# Patient Record
Sex: Female | Born: 1940 | Race: White | Hispanic: No | Smoking: Former smoker
Health system: Southern US, Community
[De-identification: ages and names within clinical notes are randomized; demographics above are authoritative.]

## PROBLEM LIST (undated history)

## (undated) DIAGNOSIS — R51 Headache: Secondary | ICD-10-CM

## (undated) DIAGNOSIS — N644 Mastodynia: Secondary | ICD-10-CM

## (undated) DIAGNOSIS — K219 Gastro-esophageal reflux disease without esophagitis: Secondary | ICD-10-CM

## (undated) DIAGNOSIS — F419 Anxiety disorder, unspecified: Secondary | ICD-10-CM

## (undated) DIAGNOSIS — R319 Hematuria, unspecified: Secondary | ICD-10-CM

## (undated) DIAGNOSIS — E119 Type 2 diabetes mellitus without complications: Secondary | ICD-10-CM

## (undated) DIAGNOSIS — N63 Unspecified lump in unspecified breast: Principal | ICD-10-CM

## (undated) DIAGNOSIS — I10 Essential (primary) hypertension: Secondary | ICD-10-CM

## (undated) DIAGNOSIS — R350 Frequency of micturition: Secondary | ICD-10-CM

## (undated) HISTORY — DX: Type 2 diabetes mellitus without complications: E11.9

## (undated) HISTORY — DX: Hematuria, unspecified: R31.9

## (undated) HISTORY — PX: ABDOMINAL HYSTERECTOMY: SHX81

## (undated) HISTORY — DX: Mastodynia: N64.4

## (undated) HISTORY — PX: OTHER SURGICAL HISTORY: SHX169

## (undated) HISTORY — DX: Unspecified lump in unspecified breast: N63.0

## (undated) HISTORY — DX: Frequency of micturition: R35.0

## (undated) HISTORY — PX: CHOLECYSTECTOMY: SHX55

## (undated) HISTORY — PX: APPENDECTOMY: SHX54

---

## 1998-05-10 ENCOUNTER — Other Ambulatory Visit: Admission: RE | Admit: 1998-05-10 | Discharge: 1998-05-10 | Payer: Self-pay | Admitting: Obstetrics and Gynecology

## 1999-12-26 ENCOUNTER — Encounter: Payer: Self-pay | Admitting: Obstetrics and Gynecology

## 1999-12-26 ENCOUNTER — Encounter: Admission: RE | Admit: 1999-12-26 | Discharge: 1999-12-26 | Payer: Self-pay | Admitting: Obstetrics and Gynecology

## 2000-07-17 ENCOUNTER — Other Ambulatory Visit: Admission: RE | Admit: 2000-07-17 | Discharge: 2000-07-17 | Payer: Self-pay | Admitting: Obstetrics and Gynecology

## 2001-01-09 ENCOUNTER — Encounter: Admission: RE | Admit: 2001-01-09 | Discharge: 2001-01-09 | Payer: Self-pay | Admitting: Obstetrics and Gynecology

## 2001-01-09 ENCOUNTER — Encounter: Payer: Self-pay | Admitting: Obstetrics and Gynecology

## 2001-07-10 ENCOUNTER — Other Ambulatory Visit: Admission: RE | Admit: 2001-07-10 | Discharge: 2001-07-10 | Payer: Self-pay | Admitting: Obstetrics and Gynecology

## 2001-07-16 ENCOUNTER — Encounter: Payer: Self-pay | Admitting: Obstetrics and Gynecology

## 2001-07-16 ENCOUNTER — Encounter: Admission: RE | Admit: 2001-07-16 | Discharge: 2001-07-16 | Payer: Self-pay | Admitting: Obstetrics and Gynecology

## 2001-10-21 ENCOUNTER — Ambulatory Visit (HOSPITAL_COMMUNITY): Admission: RE | Admit: 2001-10-21 | Discharge: 2001-10-21 | Payer: Self-pay | Admitting: General Surgery

## 2002-01-21 ENCOUNTER — Encounter: Admission: RE | Admit: 2002-01-21 | Discharge: 2002-01-21 | Payer: Self-pay | Admitting: Obstetrics and Gynecology

## 2002-01-21 ENCOUNTER — Encounter: Payer: Self-pay | Admitting: Obstetrics and Gynecology

## 2002-01-28 ENCOUNTER — Encounter: Payer: Self-pay | Admitting: Internal Medicine

## 2002-01-28 ENCOUNTER — Encounter: Admission: RE | Admit: 2002-01-28 | Discharge: 2002-01-28 | Payer: Self-pay | Admitting: Internal Medicine

## 2002-04-02 ENCOUNTER — Ambulatory Visit (HOSPITAL_COMMUNITY): Admission: RE | Admit: 2002-04-02 | Discharge: 2002-04-02 | Payer: Self-pay | Admitting: *Deleted

## 2003-01-20 ENCOUNTER — Encounter: Payer: Self-pay | Admitting: Internal Medicine

## 2003-01-20 ENCOUNTER — Encounter: Admission: RE | Admit: 2003-01-20 | Discharge: 2003-01-20 | Payer: Self-pay | Admitting: Internal Medicine

## 2003-05-10 ENCOUNTER — Encounter: Payer: Self-pay | Admitting: Emergency Medicine

## 2003-05-10 ENCOUNTER — Emergency Department (HOSPITAL_COMMUNITY): Admission: EM | Admit: 2003-05-10 | Discharge: 2003-05-10 | Payer: Self-pay | Admitting: Emergency Medicine

## 2004-01-26 ENCOUNTER — Encounter (HOSPITAL_COMMUNITY): Admission: RE | Admit: 2004-01-26 | Discharge: 2004-02-25 | Payer: Self-pay | Admitting: Internal Medicine

## 2004-02-09 ENCOUNTER — Encounter: Admission: RE | Admit: 2004-02-09 | Discharge: 2004-02-09 | Payer: Self-pay | Admitting: Internal Medicine

## 2004-07-01 ENCOUNTER — Emergency Department (HOSPITAL_COMMUNITY): Admission: EM | Admit: 2004-07-01 | Discharge: 2004-07-01 | Payer: Self-pay | Admitting: Emergency Medicine

## 2004-07-07 ENCOUNTER — Ambulatory Visit: Admission: RE | Admit: 2004-07-07 | Discharge: 2004-07-07 | Payer: Self-pay | Admitting: Emergency Medicine

## 2005-04-11 ENCOUNTER — Encounter: Admission: RE | Admit: 2005-04-11 | Discharge: 2005-04-11 | Payer: Self-pay | Admitting: Internal Medicine

## 2006-04-17 ENCOUNTER — Encounter: Admission: RE | Admit: 2006-04-17 | Discharge: 2006-04-17 | Payer: Self-pay | Admitting: Internal Medicine

## 2006-11-28 ENCOUNTER — Emergency Department (HOSPITAL_COMMUNITY): Admission: EM | Admit: 2006-11-28 | Discharge: 2006-11-28 | Payer: Self-pay | Admitting: Emergency Medicine

## 2007-05-11 ENCOUNTER — Encounter: Admission: RE | Admit: 2007-05-11 | Discharge: 2007-05-11 | Payer: Self-pay | Admitting: Obstetrics and Gynecology

## 2007-05-14 ENCOUNTER — Encounter: Admission: RE | Admit: 2007-05-14 | Discharge: 2007-05-14 | Payer: Self-pay | Admitting: Obstetrics and Gynecology

## 2007-09-03 ENCOUNTER — Encounter: Admission: RE | Admit: 2007-09-03 | Discharge: 2007-09-03 | Payer: Self-pay | Admitting: Obstetrics and Gynecology

## 2008-05-31 ENCOUNTER — Encounter: Admission: RE | Admit: 2008-05-31 | Discharge: 2008-05-31 | Payer: Self-pay | Admitting: Obstetrics and Gynecology

## 2009-06-13 ENCOUNTER — Encounter: Admission: RE | Admit: 2009-06-13 | Discharge: 2009-06-13 | Payer: Self-pay | Admitting: Obstetrics and Gynecology

## 2010-07-04 ENCOUNTER — Encounter: Admission: RE | Admit: 2010-07-04 | Discharge: 2010-07-04 | Payer: Self-pay | Admitting: Internal Medicine

## 2010-07-10 ENCOUNTER — Encounter: Admission: RE | Admit: 2010-07-10 | Discharge: 2010-07-10 | Payer: Self-pay | Admitting: Internal Medicine

## 2011-03-12 ENCOUNTER — Emergency Department (HOSPITAL_COMMUNITY): Payer: Medicare Other

## 2011-03-12 ENCOUNTER — Emergency Department (HOSPITAL_COMMUNITY)
Admission: EM | Admit: 2011-03-12 | Discharge: 2011-03-12 | Disposition: A | Discharge status: Home / Self Care | Payer: Medicare Other | Attending: Emergency Medicine | Admitting: Emergency Medicine

## 2011-03-12 DIAGNOSIS — Z7982 Long term (current) use of aspirin: Secondary | ICD-10-CM | POA: Insufficient documentation

## 2011-03-12 DIAGNOSIS — Z79899 Other long term (current) drug therapy: Secondary | ICD-10-CM | POA: Insufficient documentation

## 2011-03-12 DIAGNOSIS — R072 Precordial pain: Secondary | ICD-10-CM | POA: Insufficient documentation

## 2011-03-12 DIAGNOSIS — I10 Essential (primary) hypertension: Secondary | ICD-10-CM | POA: Insufficient documentation

## 2011-03-12 DIAGNOSIS — K219 Gastro-esophageal reflux disease without esophagitis: Secondary | ICD-10-CM | POA: Insufficient documentation

## 2011-03-12 LAB — DIFFERENTIAL
Basophils Relative: 0 % (ref 0–1)
Monocytes Absolute: 0.5 10*3/uL (ref 0.1–1.0)
Monocytes Relative: 6 % (ref 3–12)
Neutro Abs: 4.5 10*3/uL (ref 1.7–7.7)

## 2011-03-12 LAB — CBC
Hemoglobin: 13.2 g/dL (ref 12.0–15.0)
MCH: 27.1 pg (ref 26.0–34.0)
MCHC: 32 g/dL (ref 30.0–36.0)

## 2011-03-12 LAB — POCT CARDIAC MARKERS: Myoglobin, poc: 122 ng/mL (ref 12–200)

## 2011-03-12 LAB — BASIC METABOLIC PANEL
CO2: 28 mEq/L (ref 19–32)
Calcium: 9.7 mg/dL (ref 8.4–10.5)
Creatinine, Ser: 0.83 mg/dL (ref 0.4–1.2)
GFR calc Af Amer: >60 mL/min (ref >60)
Glucose, Bld: 126 mg/dL — ABNORMAL HIGH (ref 70–99)

## 2011-03-25 ENCOUNTER — Other Ambulatory Visit (HOSPITAL_COMMUNITY): Payer: Self-pay | Admitting: Internal Medicine

## 2011-03-26 ENCOUNTER — Encounter (HOSPITAL_COMMUNITY): Payer: Medicare Other | Attending: Internal Medicine

## 2011-03-26 ENCOUNTER — Encounter (HOSPITAL_COMMUNITY)
Admission: RE | Admit: 2011-03-26 | Discharge: 2011-03-26 | Disposition: A | Discharge status: Home / Self Care | Payer: Medicare Other | Source: Ambulatory Visit | Attending: Internal Medicine | Admitting: Internal Medicine

## 2011-03-26 DIAGNOSIS — R079 Chest pain, unspecified: Secondary | ICD-10-CM | POA: Insufficient documentation

## 2011-03-26 MED ORDER — TECHNETIUM TC 99M TETROFOSMIN IV KIT
30.0000 | PACK | Freq: Once | INTRAVENOUS | Status: AC | PRN
  Administered 2011-03-26: 29 via INTRAVENOUS

## 2011-03-27 ENCOUNTER — Encounter (HOSPITAL_COMMUNITY): Payer: Medicare Other

## 2011-03-27 ENCOUNTER — Encounter (HOSPITAL_COMMUNITY)
Admission: RE | Admit: 2011-03-27 | Discharge: 2011-03-27 | Disposition: A | Discharge status: Home / Self Care | Payer: Medicare Other | Source: Ambulatory Visit | Attending: Internal Medicine | Admitting: Internal Medicine

## 2011-03-27 MED ORDER — TECHNETIUM TC 99M TETROFOSMIN IV KIT
20.0000 | PACK | Freq: Once | INTRAVENOUS | Status: AC | PRN
  Administered 2011-03-27: 21.7 via INTRAVENOUS

## 2011-03-31 NOTE — Progress Notes (Signed)
  [name], Robyn Pena                ACCOUNT NO.:  [id number]  MEDICAL RECORD NO.:  [id number]          PATIENT TYPE:  LOCATION:                                 FACILITY:  PHYSICIAN:  Kingsley Callander. Ouida Sills, MD            DATE OF BIRTH:  DATE OF PROCEDURE:  03/26/2011 DATE OF DISCHARGE:                                PROGRESS NOTE   [name] underwent a Myoview stress test for evaluation of recent symptoms of chest pain.  She exercised 5 minutes 41 seconds (2 minutes 41 seconds of stage II of the Bruce protocol) attaining a maximal heart rate of 144 (95% for the age predicted maximal heart rate) and a workload of 7 minutes and discontinued exercise due to shortness of breath.  She did not experience chest pain.  There were PACs noted. There were no ST-segment changes diagnostic of ischemia.  Baseline electrocardiogram revealed normal sinus rhythm at 62 beats per minute. She was hypertensive initially at 172/90.  There was a hypertensive response to exercise reaching a peak of 208/88 in early recovery.  She has history of white coat hypertension.  IMPRESSION: 1. No evidence of exercise-induced ischemia. 2. White coat hypertension with resting hypertension and a     hypertensive response to exercise. 3. Myoview images pending.     Kingsley Callander. Ouida Sills, MD     ROF/MEDQ  D:  03/26/2011  T:  03/27/2011  Job:  [id number]  Electronically Signed by Carylon Perches MD on 03/31/2011 09:35:29 AM

## 2011-05-10 NOTE — Group Therapy Note (Signed)
NAMEJASEMINE, NAWAZ                            ACCOUNT NO.:  [id number]   MEDICAL RECORD NO.:  [id number]                  PATIENT TYPE:  PREC   LOCATION:                                       FACILITY:   PHYSICIAN:  Kingsley Callander. Ouida Sills, M.D.                  DATE OF BIRTH:   DATE OF PROCEDURE:  01/26/2004  DATE OF DISCHARGE:                                   PROGRESS NOTE   PROCEDURE:  Cardiolite stress test.   DESCRIPTION OF PROCEDURE:  The patient exercised 7 minutes (one minute into  stage 3 of the Bruce protocol), attaining a maximal heart rate of 161 (101%  of the age-predicted maximal heart rate), at a workload of 10.1 mets and  discontinued exercise after surpassing her target heart rate.  There were  infrequent atrial premature complexes.  There were no symptoms of chest  pain.  There were no ST-segment changes diagnostic of ischemia.  There was a  hypertensive blood pressure response to exercise, reaching a peak of 224/92  in recovery.  The baseline electrocardiogram revealed normal sinus rhythm at  83 beats per minute.   IMPRESSION:  1. No evidence of exercise-induced ischemia.  2. Cardiolite images are pending.      ___________________________________________                                            Kingsley Callander. Ouida Sills, M.D.   ROF/MEDQ  D:  01/26/2004  T:  01/26/2004  Job:  [id number]

## 2011-05-10 NOTE — Op Note (Signed)
[facility]  Patient:    Robyn Pena, Robyn Pena Visit Number: [id number] MRN: [id number]          Service Type: DSU Location: DAY Attending Physician:  Dalia Heading Dictated by:   Franky Macho, M.D. Proc. Date: 10/21/01 Admit Date:  10/21/2001   CC:         Carylon Perches, M.D.   Operative Report  PATIENT AGE:  70 years.  PREOPERATIVE DIAGNOSIS:  Cholecystitis, cholelithiasis.  POSTOPERATIVE DIAGNOSIS:  Cholecystitis, cholelithiasis.  OPERATION:  Laparoscopic cholecystectomy.  SURGEON:  Franky Macho, M.D.  ASSISTANT:  Arna Snipe, M.D.  ANESTHESIA:  General endotracheal anesthesia.  INDICATIONS:  The patient is a 70 year old white female who presents with biliary colic secondary to cholelithiasis.  The risks and benefits of the procedure including bleeding, infection, hepatobiliary injury, and the possibility of an open procedure were fully explained to the patient who gave informed consent.  DESCRIPTION OF PROCEDURE:  The patient was placed in the supine position. After induction of general endotracheal anesthesia, the abdomen was prepped and draped using the usual sterile technique with Betadine.  A supraumbilical incision was made down to the fascia.  A Veress needle was introduced into the abdominal cavity, and confirmation of placement was done using the saline drop test.  The abdomen was then insufflated with 16 mmHg pressure.  An 11 mm trocar was introduced into the abdominal cavity under direct visualization without difficulty.  The patient was placed in reverse Trendelenburg position, and an additional 11 mm trocar was placed in the epigastric region under direct visualization.  An additional 5 mm trocar was then placed in the right upper quadrant, right flank regions.  The liver was inspected and noted to be within normal limits.  The gallbladder was retracted superiorly and laterally.  The dissection was begun around the  infundibulum of the gallbladder.  The cystic duct was first identified, the juncture to the infundibulum fully identified.  Endoclips were placed proximally and distally on the cystic duct, and the cystic duct was divided.  This was likewise done on the cystic artery.   The gallbladder was then freed away from the gallbladder fossa using Bovie electrocautery.  The gallbladder was delivered through the epigastric trocar site using an Endocatch bag.  The gallbladder fossa was inspected, and no abnormal bleeding or bile leakage was noted. Surgicel was placed in the gallbladder fossa.  Subhepatic space as well as right hepatic gutter were irrigated with normal saline.  All fluid and air were then evacuated from the abdominal cavity prior to removing the trocars.  All wounds were irrigated with normal saline.  All wounds were injected with 0.5% Marcaine.  The supraumbilical fascia as well as epigastric fascia were reapproximated using an 0 Vicryl interrupted suture.  All skin incisions were closed using staples.  Betadine ointment and dry sterile dressings were applied.  All tape and needle counts were correct at the end of the procedure.  The patient was extubated in the operating room and went back to the recovery room awake and in stable condition.  COMPLICATIONS:  None.  SPECIMEN:  Gallbladder with stones.  ESTIMATED BLOOD LOSS:  Minimal. Dictated by:   Franky Macho, M.D. Attending Physician:  Dalia Heading DD:  10/21/01 TD:  10/21/01 Job: [id number]

## 2011-05-30 ENCOUNTER — Other Ambulatory Visit: Payer: Self-pay | Admitting: Internal Medicine

## 2011-05-30 DIAGNOSIS — Z1231 Encounter for screening mammogram for malignant neoplasm of breast: Secondary | ICD-10-CM

## 2011-07-05 ENCOUNTER — Ambulatory Visit (INDEPENDENT_AMBULATORY_CARE_PROVIDER_SITE_OTHER): Payer: Medicare Other | Admitting: Urology

## 2011-07-05 DIAGNOSIS — R351 Nocturia: Secondary | ICD-10-CM

## 2011-07-05 DIAGNOSIS — R350 Frequency of micturition: Secondary | ICD-10-CM

## 2011-07-10 ENCOUNTER — Ambulatory Visit
Admission: RE | Admit: 2011-07-10 | Discharge: 2011-07-10 | Disposition: A | Discharge status: Home / Self Care | Payer: Medicare Other | Source: Ambulatory Visit | Attending: Internal Medicine | Admitting: Internal Medicine

## 2011-07-10 DIAGNOSIS — Z1231 Encounter for screening mammogram for malignant neoplasm of breast: Secondary | ICD-10-CM

## 2011-07-11 ENCOUNTER — Other Ambulatory Visit: Payer: Self-pay | Admitting: Internal Medicine

## 2011-07-11 DIAGNOSIS — R928 Other abnormal and inconclusive findings on diagnostic imaging of breast: Secondary | ICD-10-CM

## 2011-07-23 ENCOUNTER — Other Ambulatory Visit: Payer: Medicare Other

## 2011-07-24 ENCOUNTER — Ambulatory Visit
Admission: RE | Admit: 2011-07-24 | Discharge: 2011-07-24 | Disposition: A | Discharge status: Home / Self Care | Payer: Medicare Other | Source: Ambulatory Visit | Attending: Internal Medicine | Admitting: Internal Medicine

## 2011-07-24 DIAGNOSIS — R928 Other abnormal and inconclusive findings on diagnostic imaging of breast: Secondary | ICD-10-CM

## 2012-01-07 ENCOUNTER — Encounter: Payer: Self-pay | Admitting: Internal Medicine

## 2012-04-01 ENCOUNTER — Other Ambulatory Visit (INDEPENDENT_AMBULATORY_CARE_PROVIDER_SITE_OTHER): Payer: Self-pay | Admitting: *Deleted

## 2012-04-01 ENCOUNTER — Telehealth (INDEPENDENT_AMBULATORY_CARE_PROVIDER_SITE_OTHER): Payer: Self-pay | Admitting: *Deleted

## 2012-04-01 DIAGNOSIS — Z1211 Encounter for screening for malignant neoplasm of colon: Secondary | ICD-10-CM

## 2012-04-01 MED ORDER — PEG-KCL-NACL-NASULF-NA ASC-C 100 G PO SOLR: 1.0000 | Freq: Once | ORAL | Status: DC

## 2012-04-01 NOTE — Telephone Encounter (Signed)
Patient needs movi prep 

## 2012-05-05 ENCOUNTER — Encounter (INDEPENDENT_AMBULATORY_CARE_PROVIDER_SITE_OTHER): Payer: Self-pay | Admitting: *Deleted

## 2012-05-06 ENCOUNTER — Telehealth (INDEPENDENT_AMBULATORY_CARE_PROVIDER_SITE_OTHER): Payer: Self-pay | Admitting: *Deleted

## 2012-05-06 NOTE — Telephone Encounter (Signed)
PCP/Requesting MD: fagan  Name & DOB: Robyn Pena August 26, 2041     Procedure: tcs  Reason/Indication:  screening  Has patient had this procedure before?  yes  If so, when, by whom and where?  4/03  Is there a family history of colon cancer?  no  Who?  What age when diagnosed?    Is patient diabetic?   no      Does patient have prosthetic heart valve?  no  Do you have a pacemaker?  no  Has patient had joint replacement within last 12 months?  no  Is patient on Coumadin, Plavix and/or Aspirin? yes  Medications: asa 81 mg daily, omeprazole 20 mg bid, klor-con 20 mg daily, verapamil 240 mg in am & 120 mg in pm, losaratin/hcyz 100/25 mg daily, pepcid daily  Allergies: vicodin  Medication Adjustment: asa 2 days  Procedure date & time: 05/28/12 @ 830

## 2012-05-12 NOTE — Telephone Encounter (Signed)
agree

## 2012-05-22 ENCOUNTER — Encounter (HOSPITAL_COMMUNITY): Payer: Self-pay | Admitting: Pharmacy Technician

## 2012-05-28 ENCOUNTER — Encounter (HOSPITAL_COMMUNITY)
Admission: RE | Disposition: A | Discharge status: Home / Self Care | Payer: Self-pay | Source: Ambulatory Visit | Attending: Internal Medicine

## 2012-05-28 ENCOUNTER — Encounter (HOSPITAL_COMMUNITY): Payer: Self-pay | Admitting: *Deleted

## 2012-05-28 ENCOUNTER — Ambulatory Visit (HOSPITAL_COMMUNITY)
Admission: RE | Admit: 2012-05-28 | Discharge: 2012-05-28 | Disposition: A | Discharge status: Home / Self Care | Payer: Medicare Other | Source: Ambulatory Visit | Attending: Internal Medicine | Admitting: Internal Medicine

## 2012-05-28 DIAGNOSIS — K644 Residual hemorrhoidal skin tags: Secondary | ICD-10-CM | POA: Insufficient documentation

## 2012-05-28 DIAGNOSIS — Z79899 Other long term (current) drug therapy: Secondary | ICD-10-CM | POA: Insufficient documentation

## 2012-05-28 DIAGNOSIS — Z1211 Encounter for screening for malignant neoplasm of colon: Secondary | ICD-10-CM | POA: Insufficient documentation

## 2012-05-28 DIAGNOSIS — K5730 Diverticulosis of large intestine without perforation or abscess without bleeding: Secondary | ICD-10-CM | POA: Insufficient documentation

## 2012-05-28 HISTORY — DX: Headache: R51

## 2012-05-28 HISTORY — DX: Essential (primary) hypertension: I10

## 2012-05-28 HISTORY — DX: Anxiety disorder, unspecified: F41.9

## 2012-05-28 HISTORY — DX: Gastro-esophageal reflux disease without esophagitis: K21.9

## 2012-05-28 HISTORY — PX: COLONOSCOPY: SHX5424

## 2012-05-28 SURGERY — COLONOSCOPY
Anesthesia: Moderate Sedation

## 2012-05-28 MED ORDER — SODIUM CHLORIDE 0.45 % IV SOLN
Freq: Once | INTRAVENOUS | Status: AC
  Administered 2012-05-28: 11:00:00 via INTRAVENOUS

## 2012-05-28 MED ORDER — MIDAZOLAM HCL 5 MG/5ML IJ SOLN
INTRAMUSCULAR | Status: AC
  Filled 2012-05-28: qty 10

## 2012-05-28 MED ORDER — MIDAZOLAM HCL 5 MG/5ML IJ SOLN
INTRAMUSCULAR | Status: DC | PRN
  Administered 2012-05-28 (×2): 1 mg via INTRAVENOUS
  Administered 2012-05-28 (×2): 2 mg via INTRAVENOUS

## 2012-05-28 MED ORDER — MEPERIDINE HCL 50 MG/ML IJ SOLN
INTRAMUSCULAR | Status: AC
  Filled 2012-05-28: qty 1

## 2012-05-28 MED ORDER — MEPERIDINE HCL 50 MG/ML IJ SOLN
INTRAMUSCULAR | Status: DC | PRN
  Administered 2012-05-28 (×2): 25 mg via INTRAVENOUS

## 2012-05-28 NOTE — Discharge Instructions (Signed)
Resume usual medications. High fiber diet. No driving for 24 hours. Next screening exam in 10 years.  Colonoscopy Care After Read the instructions outlined below and refer to this sheet in the next few weeks. These discharge instructions provide you with general information on caring for yourself after you leave the hospital. Your doctor may also give you specific instructions. While your treatment has been planned according to the most current medical practices available, unavoidable complications occasionally occur. If you have any problems or questions after discharge, call your doctor. HOME CARE INSTRUCTIONS ACTIVITY:  You may resume your regular activity, but move at a slower pace for the next 24 hours.   Take frequent rest periods for the next 24 hours.   Walking will help get rid of the air and reduce the bloated feeling in your belly (abdomen).   No driving for 24 hours (because of the medicine (anesthesia) used during the test).   You may shower.   Do not sign any important legal documents or operate any machinery for 24 hours (because of the anesthesia used during the test).  NUTRITION:  Drink plenty of fluids.   You may resume your normal diet as instructed by your doctor.   Begin with a light meal and progress to your normal diet. Heavy or fried foods are harder to digest and may make you feel sick to your stomach (nauseated).   Avoid alcoholic beverages for 24 hours or as instructed.  MEDICATIONS:  You may resume your normal medications unless your doctor tells you otherwise.  WHAT TO EXPECT TODAY:  Some feelings of bloating in the abdomen.   Passage of more gas than usual.   Spotting of blood in your stool or on the toilet paper.  IF YOU HAD POLYPS REMOVED DURING THE COLONOSCOPY:  No aspirin products for 7 days or as instructed.   No alcohol for 7 days or as instructed.   Eat a soft diet for the next 24 hours.  FINDING OUT THE RESULTS OF YOUR TEST Not  all test results are available during your visit. If your test results are not back during the visit, make an appointment with your caregiver to find out the results. Do not assume everything is normal if you have not heard from your caregiver or the medical facility. It is important for you to follow up on all of your test results.  SEEK IMMEDIATE MEDICAL CARE IF:  You have more than a spotting of blood in your stool.   Your belly is swollen (abdominal distention).   You are nauseated or vomiting.   You have a fever.   You have abdominal pain or discomfort that is severe or gets worse throughout the day.  Document Released: 07/23/2004 Document Revised: 11/28/2011 Document Reviewed: 07/21/2008 [facility] Patient Information 2012 [place], [place].Diverticulosis Diverticulosis is a common condition that develops when small pouches (diverticula) form in the wall of the colon. The risk of diverticulosis increases with age. It happens more often in people who eat a low-fiber diet. Most individuals with diverticulosis have no symptoms. Those individuals with symptoms usually experience abdominal pain, constipation, or loose stools (diarrhea). HOME CARE INSTRUCTIONS   Increase the amount of fiber in your diet as directed by your caregiver or dietician. This may reduce symptoms of diverticulosis.   Your caregiver may recommend taking a dietary fiber supplement.   Drink at least 6 to 8 glasses of water each day to prevent constipation.   Try not to strain when you have  a bowel movement.   Your caregiver may recommend avoiding nuts and seeds to prevent complications, although this is still an uncertain benefit.   Only take over-the-counter or prescription medicines for pain, discomfort, or fever as directed by your caregiver.  FOODS WITH HIGH FIBER CONTENT INCLUDE:  Fruits. Apple, peach, pear, tangerine, raisins, prunes.   Vegetables. Brussels sprouts, asparagus, broccoli, cabbage, carrot,  cauliflower, romaine lettuce, spinach, summer squash, tomato, winter squash, zucchini.   Starchy Vegetables. Baked beans, kidney beans, lima beans, split peas, lentils, potatoes (with skin).   Grains. Whole wheat bread, brown rice, bran flake cereal, plain oatmeal, white rice, shredded wheat, bran muffins.  SEEK IMMEDIATE MEDICAL CARE IF:   You develop increasing pain or severe bloating.   You have an oral temperature above 102 F (38.9 C), not controlled by medicine.   You develop vomiting or bowel movements that are bloody or black.  Document Released: 09/05/2004 Document Revised: 11/28/2011 Document Reviewed: 05/09/2010 [facility] Patient Information 2012 [place], [place].  Hemorrhoids Hemorrhoids are enlarged (dilated) veins around the rectum. There are 2 types of hemorrhoids, and the type of hemorrhoid is determined by its location. Internal hemorrhoids occur in the veins just inside the rectum.They are usually not painful, but they may bleed.However, they may poke through to the outside and become irritated and painful. External hemorrhoids involve the veins outside the anus and can be felt as a painful swelling or hard lump near the anus.They are often itchy and may crack and bleed. Sometimes clots will form in the veins. This makes them swollen and painful. These are called thrombosed hemorrhoids. CAUSES Causes of hemorrhoids include:  Pregnancy. This increases the pressure in the hemorrhoidal veins.   Constipation.   Straining to have a bowel movement.   Obesity.   Heavy lifting or other activity that caused you to strain.  TREATMENT Most of the time hemorrhoids improve in 1 to 2 weeks. However, if symptoms do not seem to be getting better or if you have a lot of rectal bleeding, your caregiver may perform a procedure to help make the hemorrhoids get smaller or remove them completely.Possible treatments include:  Rubber band ligation. A rubber band is placed at the base of  the hemorrhoid to cut off the circulation.   Sclerotherapy. A chemical is injected to shrink the hemorrhoid.   Infrared light therapy. Tools are used to burn the hemorrhoid.   Hemorrhoidectomy. This is surgical removal of the hemorrhoid.  HOME CARE INSTRUCTIONS   Increase fiber in your diet. Ask your caregiver about using fiber supplements.   Drink enough water and fluids to keep your urine clear or pale yellow.   Exercise regularly.   Go to the bathroom when you have the urge to have a bowel movement. Do not wait.   Avoid straining to have bowel movements.   Keep the anal area dry and clean.   Only take over-the-counter or prescription medicines for pain, discomfort, or fever as directed by your caregiver.  If your hemorrhoids are thrombosed:  Take warm sitz baths for 20 to 30 minutes, 3 to 4 times per day.   If the hemorrhoids are very tender and swollen, place ice packs on the area as tolerated. Using ice packs between sitz baths may be helpful. Fill a plastic bag with ice. Place a towel between the bag of ice and your skin.   Medicated creams and suppositories may be used or applied as directed.   Do not use a donut-shaped pillow or  sit on the toilet for long periods. This increases blood pooling and pain.  SEEK MEDICAL CARE IF:   You have increasing pain and swelling that is not controlled with your medicine.   You have uncontrolled bleeding.   You have difficulty or you are unable to have a bowel movement.   You have pain or inflammation outside the area of the hemorrhoids.   You have chills or an oral temperature above 102 F (38.9 C).  MAKE SURE YOU:   Understand these instructions.   Will watch your condition.   Will get help right away if you are not doing well or get worse.  Document Released: 12/06/2000 Document Revised: 11/28/2011 Document Reviewed: 04/12/2008 [facility] Patient Information 2012 [place], [place].

## 2012-05-28 NOTE — H&P (Signed)
Robyn Pena is an 71 y.o. female.   Chief Complaint: Patient is here for colonoscopy. HPI: Patient is 71 year old Caucasian female who is in for average risk screening colonoscopy. Patient's last exam was 10 years ago. She denies melena or rectal bleeding. Bowel movements are generally regular. Family history is negative for colorectal carcinoma.  Past Medical History  Diagnosis Date  . Hypertension   . Headache     Ocular Migraines  . GERD (gastroesophageal reflux disease)   . Anxiety     occasional panic attacks    Past Surgical History  Procedure Date  . Abdominal hysterectomy   . Cholecystectomy   . Cyst removed from cervix     No family history on file. Social History:  reports that she has quit smoking. Her smoking use included Cigarettes. She has a 18 pack-year smoking history. She does not have any smokeless tobacco history on file. She reports that she does not drink alcohol or use illicit drugs.  Allergies:  Allergies  Allergen Reactions  . Hydrocodone Nausea And Vomiting  . Tape Rash    (adhesive,band-aids)    Medications Prior to Admission  Medication Sig Dispense Refill  . aspirin EC 81 MG tablet Take 81 mg by mouth daily.      . famotidine (PEPCID AC) 10 MG chewable tablet Chew 10 mg by mouth every evening.      Marland Kitchen losartan-hydrochlorothiazide (HYZAAR) 100-25 MG per tablet Take 1 tablet by mouth daily.      Marland Kitchen omeprazole (PRILOSEC) 20 MG capsule Take 20 mg by mouth 2 (two) times daily.      . peg 3350 powder (MOVIPREP) SOLR Take 1 kit (100 g total) by mouth once.  1 kit  0  . potassium chloride SA (K-DUR,KLOR-CON) 20 MEQ tablet Take 20 mEq by mouth daily.      . verapamil (CALAN-SR) 120 MG CR tablet Take 120 mg by mouth at bedtime.      . verapamil (CALAN-SR) 240 MG CR tablet Take 240 mg by mouth daily.        No results found for this or any previous visit (from the past 48 hour(s)). No results found.  ROS  Blood pressure 170/73, pulse 62, temperature  98.4 F (36.9 C), temperature source Oral, resp. rate 20, height 5\' 9"  (1.753 m), weight 225 lb (102.059 kg), SpO2 94.00%. Physical Exam  Constitutional: She appears well-developed and well-nourished.  HENT:  Mouth/Throat: Oropharynx is clear and moist.  Eyes: Conjunctivae are normal.  Neck: No thyromegaly present.  Cardiovascular: Normal rate, regular rhythm and normal heart sounds.   No murmur heard. Respiratory: Effort normal.  GI: Soft. She exhibits no distension. There is no tenderness.  Musculoskeletal: She exhibits no edema.  Lymphadenopathy:    She has no cervical adenopathy.  Neurological: She is alert.  Skin: Skin is warm.     Assessment/Plan Average risk screening colonoscopy  Lauri Till U 05/28/2012, 11:14 AM

## 2012-05-28 NOTE — Op Note (Signed)
COLONOSCOPY PROCEDURE REPORT  PATIENT:  Robyn Pena  MR#:  [id number] Birthdate:  [birth year], 71 y.o., female Endoscopist:  Dr. Malissa Hippo, MD Referred By:  Dr. Carylon Perches, MD Procedure Date: 05/28/2012  Procedure:   Colonoscopy  Indications: Patient is 71 year old Caucasian female was undergoing average risk screening colonoscopy. Her last exam was 10 years ago.  Informed Consent:  The procedure and risks were reviewed with the patient and informed consent was obtained.  Medications:  Demerol 50 mg IV Versed 6 mg IV  Description of procedure:  After a digital rectal exam was performed, that colonoscope was advanced from the anus through the rectum and colon to the area of the cecum, ileocecal valve and appendiceal orifice. The cecum was deeply intubated. These structures were well-seen and photographed for the record. From the level of the cecum and ileocecal valve, the scope was slowly and cautiously withdrawn. The mucosal surfaces were carefully surveyed utilizing scope tip to flexion to facilitate fold flattening as needed. The scope was pulled down into the rectum where a thorough exam including retroflexion was performed.  Findings:   Prep satisfactory. Few diverticula at sigmoid colon. Normal rectal mucosa. Small hemorrhoids below the dentate line.  Therapeutic/Diagnostic Maneuvers Performed:  None  Complications:  None  Cecal Withdrawal Time:  13 minutes  Impression:  Examination performed to cecum. Mild sigmoid colon diverticulosis and external hemorrhoids otherwise normal examination  Recommendations:  Standard instructions given. Next screening exam in 10 years.  Suellyn Meenan U  05/28/2012 11:51 AM  CC: Dr. Carylon Perches, MD, MD & Dr. Bonnetta Barry ref. provider found

## 2012-06-02 ENCOUNTER — Encounter (HOSPITAL_COMMUNITY): Payer: Self-pay | Admitting: Internal Medicine

## 2012-07-28 ENCOUNTER — Encounter (INDEPENDENT_AMBULATORY_CARE_PROVIDER_SITE_OTHER): Payer: Self-pay | Admitting: Internal Medicine

## 2012-07-28 ENCOUNTER — Encounter (INDEPENDENT_AMBULATORY_CARE_PROVIDER_SITE_OTHER): Payer: Self-pay | Admitting: *Deleted

## 2012-07-28 ENCOUNTER — Other Ambulatory Visit (INDEPENDENT_AMBULATORY_CARE_PROVIDER_SITE_OTHER): Payer: Self-pay | Admitting: *Deleted

## 2012-07-28 ENCOUNTER — Ambulatory Visit (INDEPENDENT_AMBULATORY_CARE_PROVIDER_SITE_OTHER): Payer: Medicare Other | Admitting: Internal Medicine

## 2012-07-28 VITALS — BP 140/70 | HR 66 | Temp 98.3°F | Ht 69.0 in | Wt 225.3 lb

## 2012-07-28 DIAGNOSIS — G43909 Migraine, unspecified, not intractable, without status migrainosus: Secondary | ICD-10-CM | POA: Insufficient documentation

## 2012-07-28 DIAGNOSIS — K219 Gastro-esophageal reflux disease without esophagitis: Secondary | ICD-10-CM

## 2012-07-28 DIAGNOSIS — R079 Chest pain, unspecified: Secondary | ICD-10-CM

## 2012-07-28 DIAGNOSIS — I10 Essential (primary) hypertension: Secondary | ICD-10-CM

## 2012-07-28 NOTE — Patient Instructions (Addendum)
EGD. The risks and benefits such as perforation, bleeding, and infection were reviewed with the patient and is agreeable. 

## 2012-07-28 NOTE — Telephone Encounter (Signed)
This encounter was created in error - please disregard.

## 2012-07-28 NOTE — Progress Notes (Signed)
Subjective:     Patient ID: Robyn Pena, female   DOB: 1941-10-18, 71 y.o.   MRN: [id number]  HPI  Robyn Pena is a 71 yr old female presents today with c/o that Sunday she had chest pain and belching. Continued thru Sunday. The chest pain has resolved. She is still belching.  She had chills Sunday. She says she cannot pinpoint anything that provokes this.  She says the chest pain is on the left and sometimes it is on the rt.  Also occurred on March of last year.She tells me she has had these symptoms for several years.  She had a stress test, cardiac work up which was negative. She is able to eat Mexican without any problem. She continues to have belching. Dr. Fagan doubled her Nexium and this has not changed her symptoms.  No epigastric pain.  She tells me that she is belching after every meal.  She occasionally has dysphagia about every 3-4 months. Appetite is usually good. She has lost about 7 pounds since June.  BM x 1 a Pena.Brown in color. No melena or bright red rectal bleeding.  05/2012 Colonoscopy:Prep satisfactory.  Few diverticula at sigmoid colon.  Normal rectal mucosa.  Small hemorrhoids below the dentate line.     Review of Systems see hpi Current Outpatient Prescriptions  Medication Sig Dispense Refill  . aspirin EC 81 MG tablet Take 81 mg by mouth daily.      . esomeprazole (NEXIUM) 40 MG capsule Take 40 mg by mouth 2 (two) times daily before a meal.      . famotidine-calcium carbonate-magnesium hydroxide (PEPCID COMPLETE) 10-800-165 MG CHEW Chew 1 tablet by mouth daily as needed.      . losartan-hydrochlorothiazide (HYZAAR) 100-25 MG per tablet Take 1 tablet by mouth daily.      . potassium chloride SA (K-DUR,KLOR-CON) 20 MEQ tablet Take 20 mEq by mouth daily.      . verapamil (CALAN-SR) 120 MG CR tablet Take 120 mg by mouth at bedtime.      . verapamil (CALAN-SR) 240 MG CR tablet Take 240 mg by mouth daily.      . famotidine (PEPCID AC) 10 MG chewable tablet Chew 10 mg by  mouth every evening.      . omeprazole (PRILOSEC) 20 MG capsule Take 20 mg by mouth 2 (two) times daily.       Past Medical History  Diagnosis Date  . Hypertension   . Headache     Ocular Migraines  . GERD (gastroesophageal reflux disease)   . Anxiety     occasional panic attacks   Past Surgical History  Procedure Date  . Abdominal hysterectomy   . Cyst removed from cervix   . Colonoscopy 05/28/2012    Procedure: COLONOSCOPY;  Surgeon: Najeeb U Rehman, MD;  Location: [facility];  Service: Endoscopy;  Laterality: N/A;  830  . Cholecystectomy     20 02   History   Social History  . Marital Status: Married    Spouse Name: N/A    Number of Children: N/A  . Years of Education: N/A   Occupational History  . Not on file.   Social History Main Topics  . Smoking status: Former Smoker -- 2.0 packs/Pena for 9 years    Types: Cigarettes  . Smokeless tobacco: Not on file  . Alcohol Use: No  . Drug Use: No  . Sexually Active:    Other Topics Concern  . Not on file   Social  History Narrative  . No narrative on file   No family status information on file.   Allergies  Allergen Reactions  . Hydrocodone Nausea And Vomiting  . Tape Rash    (adhesive,band-aids)        Objective:   Physical Exam Filed Vitals:   07/28/12 1500  Height: 5\' 9"  (1.753 m)  Weight: 225 lb 4.8 oz (102.195 kg)   Alert and oriented. Skin warm and dry. Oral mucosa is moist.   . Sclera anicteric, conjunctivae is pink. Thyroid not enlarged. No cervical lymphadenopathy. Lungs clear. Heart regular rate and rhythm.  Abdomen is soft. Bowel sounds are positive. No hepatomegaly. No abdominal masses felt. No tenderness.  No edema to lower extremities      Assessment:   Chest pain. Belching. PUD needs to be ruled out. She has had treatment failure with multiple PPIs. Cardiac work up negative.    Plan:    Continue Nexium BID. Stop Pepcid. EGD/ED. The risks and benefits such as perforation, bleeding, and  infection were reviewed with the patient and is agreeable. Bland diet given to patient. Advised if pain becomes severe she should go to the ED

## 2012-07-29 ENCOUNTER — Encounter (HOSPITAL_COMMUNITY): Payer: Self-pay | Admitting: Pharmacy Technician

## 2012-08-07 ENCOUNTER — Encounter (HOSPITAL_COMMUNITY)
Admission: RE | Disposition: A | Discharge status: Home / Self Care | Payer: Self-pay | Source: Ambulatory Visit | Attending: Internal Medicine

## 2012-08-07 ENCOUNTER — Ambulatory Visit (HOSPITAL_COMMUNITY)
Admission: RE | Admit: 2012-08-07 | Discharge: 2012-08-07 | Disposition: A | Discharge status: Home / Self Care | Payer: Medicare Other | Source: Ambulatory Visit | Attending: Internal Medicine | Admitting: Internal Medicine

## 2012-08-07 ENCOUNTER — Encounter (HOSPITAL_COMMUNITY): Payer: Self-pay

## 2012-08-07 DIAGNOSIS — R141 Gas pain: Secondary | ICD-10-CM | POA: Diagnosis present

## 2012-08-07 DIAGNOSIS — E876 Hypokalemia: Principal | ICD-10-CM | POA: Diagnosis present

## 2012-08-07 DIAGNOSIS — K219 Gastro-esophageal reflux disease without esophagitis: Secondary | ICD-10-CM | POA: Diagnosis present

## 2012-08-07 DIAGNOSIS — G43909 Migraine, unspecified, not intractable, without status migrainosus: Secondary | ICD-10-CM | POA: Diagnosis present

## 2012-08-07 DIAGNOSIS — R079 Chest pain, unspecified: Secondary | ICD-10-CM

## 2012-08-07 DIAGNOSIS — K2960 Other gastritis without bleeding: Secondary | ICD-10-CM

## 2012-08-07 DIAGNOSIS — R142 Eructation: Secondary | ICD-10-CM | POA: Diagnosis present

## 2012-08-07 DIAGNOSIS — Z79899 Other long term (current) drug therapy: Secondary | ICD-10-CM | POA: Insufficient documentation

## 2012-08-07 DIAGNOSIS — R0789 Other chest pain: Secondary | ICD-10-CM | POA: Diagnosis present

## 2012-08-07 DIAGNOSIS — R1013 Epigastric pain: Secondary | ICD-10-CM | POA: Insufficient documentation

## 2012-08-07 DIAGNOSIS — K3184 Gastroparesis: Secondary | ICD-10-CM | POA: Diagnosis present

## 2012-08-07 DIAGNOSIS — I10 Essential (primary) hypertension: Secondary | ICD-10-CM | POA: Insufficient documentation

## 2012-08-07 DIAGNOSIS — D131 Benign neoplasm of stomach: Secondary | ICD-10-CM | POA: Diagnosis present

## 2012-08-07 DIAGNOSIS — F411 Generalized anxiety disorder: Secondary | ICD-10-CM | POA: Diagnosis present

## 2012-08-07 DIAGNOSIS — Z87891 Personal history of nicotine dependence: Secondary | ICD-10-CM

## 2012-08-07 DIAGNOSIS — K589 Irritable bowel syndrome without diarrhea: Secondary | ICD-10-CM | POA: Diagnosis present

## 2012-08-07 DIAGNOSIS — D696 Thrombocytopenia, unspecified: Secondary | ICD-10-CM | POA: Diagnosis present

## 2012-08-07 DIAGNOSIS — I4891 Unspecified atrial fibrillation: Secondary | ICD-10-CM | POA: Diagnosis present

## 2012-08-07 DIAGNOSIS — Z7982 Long term (current) use of aspirin: Secondary | ICD-10-CM

## 2012-08-07 DIAGNOSIS — D72829 Elevated white blood cell count, unspecified: Secondary | ICD-10-CM | POA: Diagnosis present

## 2012-08-07 DIAGNOSIS — Z885 Allergy status to narcotic agent status: Secondary | ICD-10-CM

## 2012-08-07 DIAGNOSIS — K319 Disease of stomach and duodenum, unspecified: Secondary | ICD-10-CM

## 2012-08-07 HISTORY — PX: UPPER GASTROINTESTINAL ENDOSCOPY: SHX188

## 2012-08-07 SURGERY — ESOPHAGOGASTRODUODENOSCOPY (EGD) WITH ESOPHAGEAL DILATION
Anesthesia: Moderate Sedation

## 2012-08-07 MED ORDER — MIDAZOLAM HCL 5 MG/5ML IJ SOLN
INTRAMUSCULAR | Status: AC
  Filled 2012-08-07: qty 10

## 2012-08-07 MED ORDER — SODIUM CHLORIDE 0.45 % IV SOLN
INTRAVENOUS | Status: DC
  Administered 2012-08-07: 13:00:00 via INTRAVENOUS

## 2012-08-07 MED ORDER — STERILE WATER FOR IRRIGATION IR SOLN
Status: DC | PRN
  Administered 2012-08-07: 14:00:00

## 2012-08-07 MED ORDER — MEPERIDINE HCL 25 MG/ML IJ SOLN
INTRAMUSCULAR | Status: DC | PRN
  Administered 2012-08-07 (×2): 25 mg via INTRAVENOUS

## 2012-08-07 MED ORDER — MEPERIDINE HCL 50 MG/ML IJ SOLN
INTRAMUSCULAR | Status: AC
  Filled 2012-08-07: qty 1

## 2012-08-07 MED ORDER — MIDAZOLAM HCL 5 MG/5ML IJ SOLN
INTRAMUSCULAR | Status: DC | PRN
  Administered 2012-08-07 (×4): 2 mg via INTRAVENOUS

## 2012-08-07 MED ORDER — BUTAMBEN-TETRACAINE-BENZOCAINE 2-2-14 % EX AERO
INHALATION_SPRAY | CUTANEOUS | Status: DC | PRN
  Administered 2012-08-07: 2 via TOPICAL

## 2012-08-07 MED ORDER — SUCRALFATE 1 G PO TABS: 2.0000 g | ORAL_TABLET | Freq: Every day | ORAL | Status: DC

## 2012-08-07 NOTE — H&P (Signed)
Robyn Pena is an 71 y.o. female.   Chief Complaint: Patient is here for esophagogastroduodenoscopy. HPI: Patient is 71 year old Caucasian female with symptoms of GERD for 3 years. She's been having chest pain either left or right pectoral with frequent belching. She is on omeprazole. She now she's on Nexium but could not any difference. She had bad spell over 10 days ago. Noninvasive cardiac evaluation is negative. Patient has had her gallbladder removed previously. She denies abdominal pain melena or rectal bleeding.  Past Medical History  Diagnosis Date  . Hypertension   . Headache     Ocular Migraines  . GERD (gastroesophageal reflux disease)   . Anxiety     occasional panic attacks    Past Surgical History  Procedure Date  . Abdominal hysterectomy   . Cyst removed from cervix   . Colonoscopy 05/28/2012    Procedure: COLONOSCOPY;  Surgeon: Malissa Hippo, MD;  Location: [facility];  Service: Endoscopy;  Laterality: N/A;  830  . Cholecystectomy     2002    History reviewed. No pertinent family history. Social History:  reports that she has quit smoking. Her smoking use included Cigarettes. She has a 18 pack-year smoking history. She does not have any smokeless tobacco history on file. She reports that she does not drink alcohol or use illicit drugs.  Allergies:  Allergies  Allergen Reactions  . Hydrocodone Nausea And Vomiting  . Tape Rash    (adhesive,band-aids)    Medications Prior to Admission  Medication Sig Dispense Refill  . aspirin EC 81 MG tablet Take 81 mg by mouth daily.      Marland Kitchen esomeprazole (NEXIUM) 40 MG capsule Take 40 mg by mouth 2 (two) times daily before a meal.      . ibuprofen (ADVIL,MOTRIN) 200 MG tablet Take 400 mg by mouth every 6 (six) hours as needed. Pain      . loperamide (IMODIUM) 2 MG capsule Take 2 mg by mouth 4 (four) times daily as needed. Diarrhea      . losartan-hydrochlorothiazide (HYZAAR) 100-25 MG per tablet Take 1 tablet by mouth  daily.      . potassium chloride SA (K-DUR,KLOR-CON) 20 MEQ tablet Take 20 mEq by mouth daily.      . verapamil (CALAN-SR) 120 MG CR tablet Take 120 mg by mouth at bedtime.      . verapamil (CALAN-SR) 240 MG CR tablet Take 240 mg by mouth daily.        No results found for this or any previous visit (from the past 48 hour(s)). No results found.  ROS  Blood pressure 165/70, pulse 65, temperature 98.3 F (36.8 C), temperature source Oral, resp. rate 18, SpO2 98.00%. Physical Exam  Constitutional: She appears well-developed and well-nourished.  HENT:  Mouth/Throat: Oropharynx is clear and moist.  Eyes: Conjunctivae are normal.  Neck: No thyromegaly present.  Cardiovascular: Normal rate and regular rhythm.   Murmur (faint SEM at LLSB) heard. Respiratory: Effort normal.  GI: Soft. She exhibits no distension and no mass. There is no tenderness.  Musculoskeletal: She exhibits no edema.  Lymphadenopathy:    She has no cervical adenopathy.  Neurological: She is alert.  Skin: Skin is warm and dry.     Assessment/Plan Atypical chest pain and frequent belching. Negative cardiac workup. ? Atypical GERD. Diagnostic EGD.  Broderick Fonseca U 08/07/2012, 1:59 PM

## 2012-08-07 NOTE — Op Note (Signed)
EGD PROCEDURE REPORT  PATIENT:  Robyn Pena  MR#:  [id number] Birthdate:  10/21/1941, 71 y.o., female Endoscopist:  Dr. Malissa Hippo, MD Referred By:  Dr. Carylon Perches, MD Procedure Date: 08/07/2012  Procedure:   EGD with gastric polypectomy.  Indications:  Patient is 71 year old Caucasian female was chronic GERD and maintenance PPI was recurrent chest pain with negative cardiac workup. She is here for undergoing diagnostic EGD. She is on low-dose aspirin and also uses ibuprofen therefore could have peptic ulcer disease.            Informed Consent:  The risks, benefits, alternatives & imponderables which include, but are not limited to, bleeding, infection, perforation, drug reaction and potential missed lesion have been reviewed.  The potential for biopsy, lesion removal, esophageal dilation, etc. have also been discussed.  Questions have been answered.  All parties agreeable.  Please see history & physical in medical record for more information.  Medications:  Demerol 50 mg IV Versed 8 mg IV Cetacaine spray topically for oropharyngeal anesthesia  Description of procedure:  The endoscope was introduced through the mouth and advanced to the second portion of the duodenum without difficulty or limitations. The mucosal surfaces were surveyed very carefully during advancement of the scope and upon withdrawal.  Findings:  Esophagus:  Mucosa of the esophagus was normal. GE junction was unremarkable. GEJ:  40 cm Stomach:  There was some bile in the stomach but no food debris noted. Folds in the proximal stomach were normal. Multiple 3-4 mm hyperplastic-appearing polyps are noted at gastric fundus and body. Approximately 7 mm polyp noted at gastric body with punctate erosions. This polyp was snared. There was oozing from polypectomy site not effectively controlled with cautery. Therefore single Hemoclip was applied with satisfactory hemostasis. Mucosae at antrum pyloric channel as well as  angularis was unremarkable. There was a single nodule with erythematous surface about 1 cm below the GE junction. This lesion was biopsied for histology. Duodenum:  Normal bulbar and post bulbar mucosa.  Therapeutic/Diagnostic Maneuvers Performed:  See above.  Complications:  Post-polypectomy oozing controlled with Hemoclip application.  Impression: Normal esophageal mucosa. Single gastric nodule at cardia just below GE junction. Biopsy taken for routine  Histology. Multiple small hyperplastic-appearing gastric polyps. 7 mm  polyp snared from gastric body. Oozing from polypectomy site controlled with Hemoclip application. Atypical symptoms may or may not be related to duodenal gastric bile reflux.  Recommendations:  No aspirin or NSAIDs for one week. Carafate 2 g by mouth each bedtime. Abdominopelvic CT to be scheduled. I will contact patient with results of biopsy.  REHMAN,NAJEEB U  08/07/2012  2:39 PM  CC: Dr. Carylon Perches, MD & Dr. Bonnetta Barry ref. provider found

## 2012-08-09 ENCOUNTER — Inpatient Hospital Stay (HOSPITAL_COMMUNITY)
Admission: EM | Admit: 2012-08-09 | Discharge: 2012-08-12 | DRG: 641 | Disposition: A | Discharge status: Home / Self Care | Payer: Medicare Other | Attending: Internal Medicine | Admitting: Internal Medicine

## 2012-08-09 ENCOUNTER — Emergency Department (HOSPITAL_COMMUNITY): Payer: Medicare Other

## 2012-08-09 ENCOUNTER — Encounter (HOSPITAL_COMMUNITY): Payer: Self-pay | Admitting: *Deleted

## 2012-08-09 DIAGNOSIS — K219 Gastro-esophageal reflux disease without esophagitis: Secondary | ICD-10-CM | POA: Diagnosis present

## 2012-08-09 DIAGNOSIS — I4891 Unspecified atrial fibrillation: Secondary | ICD-10-CM

## 2012-08-09 DIAGNOSIS — E876 Hypokalemia: Secondary | ICD-10-CM | POA: Diagnosis present

## 2012-08-09 DIAGNOSIS — I10 Essential (primary) hypertension: Secondary | ICD-10-CM | POA: Diagnosis present

## 2012-08-09 DIAGNOSIS — R112 Nausea with vomiting, unspecified: Secondary | ICD-10-CM

## 2012-08-09 LAB — COMPREHENSIVE METABOLIC PANEL
AST: 21 U/L (ref 0–37)
Albumin: 4 g/dL (ref 3.5–5.2)
Alkaline Phosphatase: 114 U/L (ref 39–117)
CO2: 26 mEq/L (ref 19–32)
Chloride: 98 mEq/L (ref 96–112)
Potassium: 2.5 mEq/L — CL (ref 3.5–5.1)
Total Bilirubin: 0.3 mg/dL (ref 0.3–1.2)

## 2012-08-09 LAB — CBC WITH DIFFERENTIAL/PLATELET
Basophils Absolute: 0 10*3/uL (ref 0.0–0.1)
Basophils Relative: 0 % (ref 0–1)
Eosinophils Absolute: 0 10*3/uL (ref 0.0–0.7)
Hemoglobin: 13.7 g/dL (ref 12.0–15.0)
Lymphocytes Relative: 19 % (ref 12–46)
MCHC: 32.9 g/dL (ref 30.0–36.0)
Monocytes Relative: 9 % (ref 3–12)
Neutrophils Relative %: 72 % (ref 43–77)
RDW: 14.5 % (ref 11.5–15.5)
WBC: 20.3 10*3/uL — ABNORMAL HIGH (ref 4.0–10.5)

## 2012-08-09 LAB — CARDIAC PANEL(CRET KIN+CKTOT+MB+TROPI)
CK, MB: 1.8 ng/mL (ref 0.3–4.0)
Relative Index: INVALID (ref 0.0–2.5)
Troponin I: <0.3 ng/mL (ref <0.30)

## 2012-08-09 LAB — LIPASE, BLOOD: Lipase: 20 U/L (ref 11–59)

## 2012-08-09 MED ORDER — DILTIAZEM HCL 25 MG/5ML IV SOLN
INTRAVENOUS | Status: AC
  Filled 2012-08-09: qty 5

## 2012-08-09 MED ORDER — ONDANSETRON HCL 4 MG PO TABS: 4.0000 mg | ORAL_TABLET | Freq: Four times a day (QID) | ORAL | Status: DC | PRN

## 2012-08-09 MED ORDER — DILTIAZEM HCL 100 MG IV SOLR
5.0000 mg/h | INTRAVENOUS | Status: DC
  Administered 2012-08-09: 5 mg/h via INTRAVENOUS
  Filled 2012-08-09: qty 100

## 2012-08-09 MED ORDER — SODIUM CHLORIDE 0.9 % IV SOLN
Freq: Once | INTRAVENOUS | Status: AC
  Administered 2012-08-09: 22:00:00 via INTRAVENOUS

## 2012-08-09 MED ORDER — DILTIAZEM HCL 50 MG/10ML IV SOLN
15.0000 mg | Freq: Once | INTRAVENOUS | Status: AC
  Administered 2012-08-09: 15 mg via INTRAVENOUS
  Filled 2012-08-09: qty 3

## 2012-08-09 MED ORDER — ENOXAPARIN SODIUM 40 MG/0.4ML ~~LOC~~ SOLN
40.0000 mg | SUBCUTANEOUS | Status: DC
  Filled 2012-08-09: qty 0.4

## 2012-08-09 MED ORDER — SODIUM CHLORIDE 0.9 % IJ SOLN: 3.0000 mL | Freq: Two times a day (BID) | INTRAMUSCULAR | Status: DC

## 2012-08-09 MED ORDER — ONDANSETRON HCL 4 MG/2ML IJ SOLN: 4.0000 mg | Freq: Four times a day (QID) | INTRAMUSCULAR | Status: DC | PRN

## 2012-08-09 MED ORDER — SUCRALFATE 1 G PO TABS
2.0000 g | ORAL_TABLET | Freq: Every day | ORAL | Status: DC
  Administered 2012-08-10 – 2012-08-11 (×2): 2 g via ORAL
  Filled 2012-08-09: qty 2
  Filled 2012-08-09 (×2): qty 1
  Filled 2012-08-09 (×2): qty 2

## 2012-08-09 MED ORDER — ACETAMINOPHEN 325 MG PO TABS: 650.0000 mg | ORAL_TABLET | Freq: Four times a day (QID) | ORAL | Status: DC | PRN

## 2012-08-09 MED ORDER — ALUM & MAG HYDROXIDE-SIMETH 200-200-20 MG/5ML PO SUSP: 30.0000 mL | Freq: Four times a day (QID) | ORAL | Status: DC | PRN

## 2012-08-09 MED ORDER — ASPIRIN EC 81 MG PO TBEC
81.0000 mg | DELAYED_RELEASE_TABLET | Freq: Every day | ORAL | Status: DC
  Filled 2012-08-09 (×2): qty 1

## 2012-08-09 MED ORDER — POTASSIUM CHLORIDE 10 MEQ/100ML IV SOLN
10.0000 meq | INTRAVENOUS | Status: DC
  Administered 2012-08-09: 10 meq via INTRAVENOUS

## 2012-08-09 MED ORDER — POTASSIUM CHLORIDE 10 MEQ/100ML IV SOLN
10.0000 meq | INTRAVENOUS | Status: AC
Start: 2012-08-09 — End: 2012-08-10
  Administered 2012-08-10 (×5): 10 meq via INTRAVENOUS
  Filled 2012-08-09: qty 300
  Filled 2012-08-09 (×3): qty 100

## 2012-08-09 MED ORDER — ONDANSETRON HCL 4 MG/2ML IJ SOLN
INTRAMUSCULAR | Status: AC
  Administered 2012-08-09: 4 mg
  Filled 2012-08-09: qty 2

## 2012-08-09 MED ORDER — PANTOPRAZOLE SODIUM 40 MG PO TBEC: 40.0000 mg | DELAYED_RELEASE_TABLET | Freq: Every day | ORAL | Status: DC

## 2012-08-09 MED ORDER — ACETAMINOPHEN 650 MG RE SUPP: 650.0000 mg | Freq: Four times a day (QID) | RECTAL | Status: DC | PRN

## 2012-08-09 MED ORDER — SODIUM CHLORIDE 0.9 % IV SOLN
INTRAVENOUS | Status: DC
  Administered 2012-08-09 – 2012-08-11 (×4): via INTRAVENOUS

## 2012-08-09 MED ORDER — PANTOPRAZOLE SODIUM 40 MG PO TBEC
40.0000 mg | DELAYED_RELEASE_TABLET | Freq: Two times a day (BID) | ORAL | Status: DC
  Administered 2012-08-10 – 2012-08-12 (×5): 40 mg via ORAL
  Filled 2012-08-09 (×5): qty 1

## 2012-08-09 NOTE — ED Notes (Signed)
Pt states she has been having problems with her stomach for the past 2 weeks, states she has had severe belching and is seeing a gastroenterologist for her problems.  States she did have some episodes of chest pain several times a day, states she is not having chest pain currently.  No diarrhea but states she did see some blood when wiping after bowel movements.

## 2012-08-09 NOTE — ED Notes (Signed)
Pt c/o chest pain, belching, and n/v for over a week. Pt had an endoscopy with removal of a polyp on Friday.

## 2012-08-09 NOTE — ED Provider Notes (Signed)
History  This chart was scribed for Robyn Cooper III, MD by Ladona Ridgel Day. This patient was seen in room [facility] and the patient's care was started at 2140.   CSN: [id number]  Arrival date & time 08/09/12  2140   First MD Initiated Contact with Patient 08/09/12 2156      Chief Complaint  Patient presents with  . Chest Pain  . Emesis   Patient is a 71 y.o. female presenting with vomiting. The history is provided by the patient. No language interpreter was used.  Emesis  This is a new problem. The current episode started 12 to 24 hours ago. The problem has been gradually worsening. The emesis has an appearance of stomach contents and bilious material. There has been no fever. Associated symptoms include cough and sweats. Pertinent negatives include no chills, no diarrhea and no fever.   Robyn Pena is a 71 y.o. female who presents to the Emergency Department complaining of constant emesis since this AM and left upper shoulder pain. She states that she has had similar previous episodes and normal workup results except this time she has severe emesis/belching. She states vomitus of her stomach contents and bile. She also states constantly belching. She also complains of left upper shoulder pain since today. Her associated symptoms are feeling hot/flushed, trace blood with wiping, cough, and left upper shoulder pain. She denies any fever, ear ache, sore throat, CP, diarrhea, rash, and syncope. She recently had an endoscopy with removal of a polyp 2 days ago.    Past Medical History  Diagnosis Date  . Hypertension   . Headache     Ocular Migraines  . GERD (gastroesophageal reflux disease)   . Anxiety     occasional panic attacks    Past Surgical History  Procedure Date  . Abdominal hysterectomy   . Cyst removed from cervix   . Colonoscopy 05/28/2012    Procedure: COLONOSCOPY;  Surgeon: Malissa Hippo, MD;  Location: [facility];  Service: Endoscopy;  Laterality: N/A;  830  .  Cholecystectomy     2002    History reviewed. No pertinent family history.  History  Substance Use Topics  . Smoking status: Former Smoker -- 2.0 packs/day for 9 years    Types: Cigarettes  . Smokeless tobacco: Not on file  . Alcohol Use: No    OB History    Grav Para Term Preterm Abortions TAB SAB Ect Mult Living                  Review of Systems  Constitutional: Negative for fever and chills.  HENT: Negative for congestion and neck pain.   Respiratory: Positive for cough. Negative for shortness of breath.   Gastrointestinal: Positive for vomiting. Negative for nausea and diarrhea.       Trace blood with wiping  Musculoskeletal: Negative for back pain.  Skin: Negative for color change.  Neurological: Negative for syncope and weakness.  All other systems reviewed and are negative.    Allergies  Hydrocodone and Tape  Home Medications   Current Outpatient Rx  Name Route Sig Dispense Refill  . ESOMEPRAZOLE MAGNESIUM 40 MG PO CPDR Oral Take 40 mg by mouth 2 (two) times daily before a meal.    . LOPERAMIDE HCL 2 MG PO CAPS Oral Take 2 mg by mouth 4 (four) times daily as needed. Diarrhea    . LOSARTAN POTASSIUM-HCTZ 100-25 MG PO TABS Oral Take 1 tablet by mouth daily.    Marland Kitchen  POTASSIUM CHLORIDE CRYS ER 20 MEQ PO TBCR Oral Take 20 mEq by mouth daily.    . SUCRALFATE 1 G PO TABS Oral Take 2 tablets (2 g total) by mouth at bedtime. 60 tablet 2  . VERAPAMIL HCL ER 120 MG PO TBCR Oral Take 120 mg by mouth at bedtime.    Marland Kitchen VERAPAMIL HCL ER 240 MG PO TBCR Oral Take 240 mg by mouth daily.      Triage Vitals: BP 138/95  Pulse 81  Temp 98.3 F (36.8 C) (Oral)  Resp 22  Ht 5\' 9"  (1.753 m)  Wt 225 lb (102.059 kg)  BMI 33.23 kg/m2  SpO2 94%  Physical Exam  Nursing note and vitals reviewed. Constitutional: She is oriented to person, place, and time. She appears well-developed and well-nourished. No distress.  HENT:  Head: Normocephalic and atraumatic.  Right Ear: External  ear normal.  Left Ear: External ear normal.  Mouth/Throat: Oropharynx is clear and moist.  Eyes: Conjunctivae and EOM are normal. Pupils are equal, round, and reactive to light.  Neck: Neck supple. No tracheal deviation present.       No carotid bruit.   Cardiovascular: Normal heart sounds.        Irregular heart rate with rate varying from 90-120 bpm.    Pulmonary/Chest: Effort normal. No respiratory distress. She has no wheezes. She has no rales.  Abdominal: Soft. Bowel sounds are normal. She exhibits no distension. There is no tenderness. There is no rebound.  Musculoskeletal: Normal range of motion. She exhibits no edema and no tenderness.  Lymphadenopathy:    She has no cervical adenopathy.  Neurological: She is alert and oriented to person, place, and time. She exhibits normal muscle tone.       Moves all extremities and she is neurologically intact.   Skin: Skin is warm and dry.  Psychiatric: She has a normal mood and affect. Her behavior is normal.    ED Course  Procedures (including critical care time) DIAGNOSTIC STUDIES: Oxygen Saturation is 94% on room air, adequate by my interpretation.    COORDINATION OF CARE: At 1020 PM Discussed treatment plan with patient which includes pain/nausea medicine, IV fluids, CXR, blood work, and heart markers. Patient agrees.    Date: 08/09/2012  Rate: 125  Rhythm: atrial fibrillation  QRS Axis: normal  Intervals: normal  ST/T Wave abnormalities: ST depressions inferiorly and ST depressions laterally  Conduction Disutrbances:none  Narrative Interpretation: Abnormal EKG  Old EKG Reviewed: changes noted--was in sinus rhythm on 03/12/2011.  Results for orders placed during the hospital encounter of 08/09/12  CBC WITH DIFFERENTIAL      Component Value Range   WBC 20.3 (*) 4.0 - 10.5 K/uL   RBC 4.93  3.87 - 5.11 MIL/uL   Hemoglobin 13.7  12.0 - 15.0 g/dL   HCT 16.1  09.6 - 04.5 %   MCV 84.6  78.0 - 100.0 fL   MCH 27.8  26.0 - 34.0 pg    MCHC 32.9  30.0 - 36.0 g/dL   RDW 40.9  81.1 - 91.4 %   Platelets 142 (*) 150 - 400 K/uL   Neutrophils Relative PENDING  43 - 77 %   Neutro Abs PENDING  1.7 - 7.7 K/uL   Band Neutrophils PENDING  0 - 10 %   Lymphocytes Relative PENDING  12 - 46 %   Lymphs Abs PENDING  0.7 - 4.0 K/uL   Monocytes Relative PENDING  3 - 12 %   Monocytes  Absolute PENDING  0.1 - 1.0 K/uL   Eosinophils Relative PENDING  0 - 5 %   Eosinophils Absolute PENDING  0.0 - 0.7 K/uL   Basophils Relative PENDING  0 - 1 %   Basophils Absolute PENDING  0.0 - 0.1 K/uL   WBC Morphology PENDING     RBC Morphology PENDING     Smear Review PENDING     nRBC PENDING  0 /100 WBC   Metamyelocytes Relative PENDING     Myelocytes PENDING     Promyelocytes Absolute PENDING     Blasts PENDING    COMPREHENSIVE METABOLIC PANEL      Component Value Range   Sodium 139  135 - 145 mEq/L   Potassium 2.5 (*) 3.5 - 5.1 mEq/L   Chloride 98  96 - 112 mEq/L   CO2 26  19 - 32 mEq/L   Glucose, Bld 159 (*) 70 - 99 mg/dL   BUN 14  6 - 23 mg/dL   Creatinine, Ser 1.61  0.50 - 1.10 mg/dL   Calcium 09.6  8.4 - 04.5 mg/dL   Total Protein 8.3  6.0 - 8.3 g/dL   Albumin 4.0  3.5 - 5.2 g/dL   AST 21  0 - 37 U/L   ALT 24  0 - 35 U/L   Alkaline Phosphatase 114  39 - 117 U/L   Total Bilirubin 0.3  0.3 - 1.2 mg/dL   GFR calc non Af Amer 54 (*) >90 mL/min   GFR calc Af Amer 62 (*) >90 mL/min  TROPONIN I      Component Value Range   Troponin I <0.30  <0.30 ng/mL  APTT      Component Value Range   aPTT 28  24 - 37 seconds  PROTIME-INR      Component Value Range   Prothrombin Time 13.8  11.6 - 15.2 seconds   INR 1.04  0.00 - 1.49  LIPASE, BLOOD      Component Value Range   Lipase 20  11 - 59 U/L   Dg Chest Port 1 View  08/09/2012  *RADIOLOGY REPORT*  Clinical Data: Left-sided chest pain for 2 weeks.  Vomiting today.  PORTABLE CHEST - 1 VIEW  Comparison: 03/12/2011  Findings: Slightly shallow inspiration. The heart size and pulmonary  vascularity are normal. The lungs appear clear and expanded without focal air space disease or consolidation. No blunting of the costophrenic angles.  No pneumothorax.  Mediastinal contours appear intact.  No significant change since previous study.  IMPRESSION: No evidence of active pulmonary disease.  Original Report Authenticated By: Marlon Pel, M.D.    Lab tests show K is low at 2.5.  Will give 2 runs of KCL IV.  Pt treated with IV Cardizem, with conversion to a sinus rhythm.  Called Dr. Ouida Sills, who admitted pt.    Date: 08/09/2012  Rate: 87  Rhythm: normal sinus rhythm, muscle tremor artifact  QRS Axis: normal  Intervals: normal  ST/T Wave abnormalities: nonspecific ST/T changes  Conduction Disutrbances:none  Narrative Interpretation: Abnormal EKG  Old EKG Reviewed: changes noted--has converted to sinus rhythm.      1. Atrial fibrillation   2. Hypokalemia   3. Nausea and vomiting     I personally performed the services described in this documentation, which was scribed in my presence. The recorded information has been reviewed and considered.  Osvaldo Human, MD       Robyn Cooper III, MD 08/09/12 [phone]

## 2012-08-09 NOTE — ED Notes (Signed)
Attempted to call report to floor, unavailable staff for patient.

## 2012-08-10 ENCOUNTER — Encounter (INDEPENDENT_AMBULATORY_CARE_PROVIDER_SITE_OTHER): Payer: Self-pay | Admitting: Internal Medicine

## 2012-08-10 ENCOUNTER — Encounter (HOSPITAL_COMMUNITY): Payer: Self-pay | Admitting: *Deleted

## 2012-08-10 DIAGNOSIS — I4891 Unspecified atrial fibrillation: Secondary | ICD-10-CM

## 2012-08-10 DIAGNOSIS — I517 Cardiomegaly: Secondary | ICD-10-CM

## 2012-08-10 DIAGNOSIS — R079 Chest pain, unspecified: Secondary | ICD-10-CM

## 2012-08-10 DIAGNOSIS — I10 Essential (primary) hypertension: Secondary | ICD-10-CM

## 2012-08-10 DIAGNOSIS — K219 Gastro-esophageal reflux disease without esophagitis: Secondary | ICD-10-CM

## 2012-08-10 DIAGNOSIS — E876 Hypokalemia: Principal | ICD-10-CM

## 2012-08-10 DIAGNOSIS — R112 Nausea with vomiting, unspecified: Secondary | ICD-10-CM

## 2012-08-10 LAB — TSH: TSH: 3.355 u[IU]/mL (ref 0.350–4.500)

## 2012-08-10 LAB — MAGNESIUM: Magnesium: 1.8 mg/dL (ref 1.5–2.5)

## 2012-08-10 LAB — CARDIAC PANEL(CRET KIN+CKTOT+MB+TROPI)
CK, MB: 2 ng/mL (ref 0.3–4.0)
Relative Index: INVALID (ref 0.0–2.5)
Total CK: 86 U/L (ref 7–177)
Troponin I: <0.3 ng/mL (ref <0.30)
Troponin I: <0.3 ng/mL (ref <0.30)

## 2012-08-10 LAB — URINALYSIS, ROUTINE W REFLEX MICROSCOPIC
Glucose, UA: NEGATIVE mg/dL
Protein, ur: NEGATIVE mg/dL
Specific Gravity, Urine: >1.03 — ABNORMAL HIGH (ref 1.005–1.030)
pH: 5 (ref 5.0–8.0)

## 2012-08-10 LAB — POTASSIUM: Potassium: 4 mEq/L (ref 3.5–5.1)

## 2012-08-10 LAB — URINE MICROSCOPIC-ADD ON

## 2012-08-10 LAB — MRSA PCR SCREENING: MRSA by PCR: NEGATIVE

## 2012-08-10 MED ORDER — VERAPAMIL HCL ER 240 MG PO TBCR
240.0000 mg | EXTENDED_RELEASE_TABLET | Freq: Every day | ORAL | Status: DC
  Administered 2012-08-10 – 2012-08-12 (×3): 240 mg via ORAL
  Filled 2012-08-10 (×3): qty 1

## 2012-08-10 NOTE — Progress Notes (Signed)
Patient is very familiar to me. She has atypical chest pain and felt to have GERD but EGD last week was negative for erosive esophagitis. She underwent snare polypectomy of a gastric polyp. Biopsy result is pending. Patient was admitted yesterday to Dr. Ouida Sills service with another spell of emesis, heartburn and belching and she was also noted to be in atrial fibrillation but converted to normal sinus rhythm spontaneously while she was in emergency room. She was also found to have profound hypokalemia with serum potassium of 2.5. Serum potassium this morning was 4.0. Biopsy and gastric polyp is pending but I believe it will reveal hyperplastic polyp. Patient's episodic symptoms remain unexplained. Need to rule out biliary or pancreatic disease. She could also have intermittent partial small bowel obstruction. Will proceed with abdominopelvic CT in am.

## 2012-08-10 NOTE — Progress Notes (Signed)
*  PRELIMINARY RESULTS* Echocardiogram 2D Echocardiogram has been performed.  Robyn Pena 08/10/2012, 10:06 AM

## 2012-08-10 NOTE — Consult Note (Signed)
CARDIOLOGY CONSULT NOTE  Patient ID: Robyn Pena MRN: [id number] DOB/AGE: [birth year] 70 y.o.  Admit date: 08/09/2012 Referring Physician: Gerarda Gunther, MD Primary Cardiologist: Ellis Parents) Casa Bing MD Reason for Consultation: New Onset Atrial fibrillation  Active Problems:  Atrial fibrillation, new onset  Hypertension  GERD (gastroesophageal reflux disease)  Hypokalemia  HPI: Robyn Pena is a 71 year old patient of Dr. Ouida Sills, who was admitted with intractable vomiting, heartburn, and belching. The patient states that she has been having GI issues over the last 2 weeks which have worsening. She had a recent EGD completed by Dr. Karilyn Cota on 08/07/2012. She had a polypectomy of a single gastric nodule at the cardia just below the GE junction. She did well afterwards and was placed on a PPI. However, she began have significant amount of belching and left sided chest pain associated.Two days later she then began to have a lot of vomiting, began to have diaphoresis, and was brought emergently to the ER by her husband.   In ER she was found to be in atrial fibrillation, and hypokalemic  make with potassium of 2.5. She was placed on a Cardizem drip, and converted to normal sinus rhythm within 12 hours of potassium replacement and infusion. The patient has a history of hypertension, is on verapamil, and losartan/HCTZ. She was given hydration and is feeling much better without any recurrence of nausea or vomiting. She states she continues to have occasional belching. She has no further complaints of chest pain. She remains in normal sinus rhythm. An echocardiogram has been completed results pending.    Other history includes anxiety, GERD, remote tobacco abuse, she had a stress test completed in April of 2000 followed by Dr. Ouida Sills, read by radiologist, which was found to be normal without evidence of ischemia.   Review of systems complete and found to be negative unless listed  above   Past Medical History  Diagnosis Date  . Hypertension   . Headache     Ocular Migraines  . GERD (gastroesophageal reflux disease)   . Anxiety     occasional panic attacks    History reviewed. No pertinent family history.  History   Social History  . Marital Status: Married    Spouse Name: N/A    Number of Children: N/A  . Years of Education: N/A   Occupational History  . Not on file.   Social History Main Topics  . Smoking status: Former Smoker -- 2.0 packs/day for 9 years    Types: Cigarettes  . Smokeless tobacco: Not on file  . Alcohol Use: No  . Drug Use: No  . Sexually Active: Yes   Other Topics Concern  . Not on file   Social History Narrative  . No narrative on file    Past Surgical History  Procedure Date  . Abdominal hysterectomy   . Cyst removed from cervix   . Colonoscopy 05/28/2012    Procedure: COLONOSCOPY;  Surgeon: Malissa Hippo, MD;  Location: [facility];  Service: Endoscopy;  Laterality: N/A;  830  . Cholecystectomy     2002  . Appendectomy   . Upper gastrointestinal endoscopy 08/07/12    with polyp removal    Prescriptions prior to admission  Medication Sig Dispense Refill  . esomeprazole (NEXIUM) 40 MG capsule Take 40 mg by mouth 2 (two) times daily before a meal.      . loperamide (IMODIUM) 2 MG capsule Take 2 mg by mouth 4 (four) times daily as needed.  Diarrhea      . losartan-hydrochlorothiazide (HYZAAR) 100-25 MG per tablet Take 1 tablet by mouth daily.      . potassium chloride SA (K-DUR,KLOR-CON) 20 MEQ tablet Take 20 mEq by mouth daily.      . sucralfate (CARAFATE) 1 G tablet Take 2 tablets (2 g total) by mouth at bedtime.  60 tablet  2  . verapamil (CALAN-SR) 120 MG CR tablet Take 120 mg by mouth at bedtime.      . verapamil (CALAN-SR) 240 MG CR tablet Take 240 mg by mouth daily.      Stress Test 03/27/11 Findings: The patient did achieve target heart rate. The stress SPECT images demonstrate mildly decreased activity in  the anteroseptal region of the left ventricular myocardium, otherwise physiologic distribution of radiopharmaceutical. Rest images demonstrate similar mildly decreased anteroseptal activity without interval change. The gated stress SPECT images demonstrate normal left ventricular myocardial thickening. No focal wall motion abnormality is seen. Calculated left ventricular end-diastolic volume 88ml, end-systolic volume 29ml, ejection fraction of 67%.  IMPRESSION:  1. Negative for exercise-stress induced ischemia. 2. Stable anteroseptal scar versus attenuation. 3. Left ventricular ejection fraction 67%.  Physical Exam: Blood pressure 129/110, pulse 61, temperature 98.2 F (36.8 C), temperature source Oral, resp. rate 14, height 5\' 9"  (1.753 m), weight 223 lb 1.7 oz (101.2 kg), SpO2 97.00%.   Labs:   Lab Results  Component Value Date   WBC 20.3* 08/09/2012   HGB 13.7 08/09/2012   HCT 41.7 08/09/2012   MCV 84.6 08/09/2012   PLT 142* 08/09/2012     Lab 08/10/12 0651 08/09/12 2210  NA -- 139  K 4.0 --  CL -- 98  CO2 -- 26  BUN -- 14  CREATININE -- 1.04  CALCIUM -- 10.2  PROT -- 8.3  BILITOT -- 0.3  ALKPHOS -- 114  ALT -- 24  AST -- 21  GLUCOSE -- 159*   Lab Results  Component Value Date   CKTOTAL 75 08/10/2012   CKMB 2.0 08/10/2012   TROPONINI <0.30 08/10/2012    Radiology: Dg Chest Port 1 View  08/09/2012  *RADIOLOGY REPORT*  Clinical Data: Left-sided chest pain for 2 weeks.  Vomiting today.  PORTABLE CHEST - 1 VIEW  Comparison: 03/12/2011  Findings: Slightly shallow inspiration. The heart size and pulmonary vascularity are normal. The lungs appear clear and expanded without focal air space disease or consolidation. No blunting of the costophrenic angles.  No pneumothorax.  Mediastinal contours appear intact.  No significant change since previous study.  IMPRESSION: No evidence of active pulmonary disease.  Original Report Authenticated By: Marlon Pel, M.D.    VWU:JWJXBJ sinus rhythm Nonspecific ST and T wave abnormality  ASSESSMENT AND PLAN:   1. PAF: Most likely brought on by physiologic stress, hypokalemia, with nausea and vomiting. She now remains in normal sinus rhythm. Echocardiogram has been completed but yet not read. Stress Myoview completed in April of 2012 is reassuring. Shoulders no more complaints of chest discomfort. She is concerned about recurrence of atrial fibrillation. I believe this is a low likelihood at this time. Will review echo results for left atrial size, and valvular abnormalities. GI workup is also being requested, as the CT scan was planned by Dr. Karilyn Cota and she continued to have symptoms. Dr. Ouida Sills has consulted him.  2. Hypertension: Blood pressure initially elevated in the setting of vomiting at 140 6/85. He is now normalized with reinstitution of her medication regimen. Would not recommend removing HCTZ portion  of her antihypertensives.  Hypokalemia was probably related to her vomiting, she is on potassium replacement at home at 20 mEq daily.  Bettey Mare. Lyman Bishop NP [facility] 08/10/2012, 8:40 AM  Cardiology Attending Patient interviewed and examined. Discussed with Joni Reining, NP.  Above note annotated and modified based upon my findings.  Apparently new-onset atrial fibrillation of brief duration with onset in the setting of substantial physiologic stress and rapid spontaneous conversion to sinus rhythm.  Due to the brief duration of arrhythmia, the low to intermediate probability of thromboembolism related to atrial fibrillation and information suggests that the arrhythmia was elicited by physiologic stress, I would not be inclined to commit the patient to anticoagulation.  She also already taking daily aspirin, which can be continued. Chronic treatment with verapamil for this some protection from excessively rapid heart rate.  Patient is concerned about the likelihood of recurrence and her inability to  get checked this, as she was asymptomatic with her initial spell.  I suggested she use a home sphygmomanometer to monitor heart rate and learn to check her pulse for irregularity.  I explained that her likelihood of CVA small and that she receives enough medical care for atrial fibrillation to be diagnosed in a reasonable period of time.  Our practice will follow this nice woman following discharge until we are sure that she is stable from a cardiac standpoint.   Bing, MD 08/10/2012, 6:10 PM

## 2012-08-10 NOTE — Progress Notes (Signed)
Pt had converted from Afib to NSR in Emergency Dept., still on Cardizem drip at 5mg /hr.  Pt having pauses with decreased HR 50-60's.  Cardizem decreased to 2.5mg /hr at 0511am.  HR 57 and still having occasional pauses so Cardizem drip turned off at 0617am.  Notified Dr. Ouida Sills at [facility].  New order received and carried out.`

## 2012-08-10 NOTE — H&P (Signed)
Robyn Pena, Robyn Pena                ACCOUNT NO.:  [id number]  MEDICAL RECORD NO.:  [id number]  LOCATION:  [facility]04                          FACILITY:  [facility]  PHYSICIAN:  Kingsley Callander. Ouida Sills, MD       DATE OF BIRTH:  1941-07-26  DATE OF ADMISSION:  08/09/2012 DATE OF DISCHARGE:  LH                             HISTORY & PHYSICAL   CHIEF COMPLAINT:  Vomiting.  HISTORY OF PRESENT ILLNESS:  This patient is a 71 year old white female who presented to the emergency room after developing intractable vomiting.  The patient has had a roughly 2-week history of increased belching and reflux symptoms despite therapy.  She underwent an EGD with removal of a polyp on Friday prior to presenting to the emergency room on Sunday evening.  She had not experienced any hematemesis.  She denied abdominal pain.  She states that she started having increased belching on Sunday evening, then coughing and then vomiting which persisted. Upon presentation to the emergency room, she was treated with antiemetics and fluids.  She was found to be in atrial fibrillation with a rapid ventricular response.  She was hospitalized in a telemetry bed. She converted to sinus rhythm while still in the emergency room.  She was hypokalemic at 2.5.  She has previously been treated with HCTZ and potassium supplementation.  She has not had recent diarrhea.  PAST MEDICAL HISTORY:  Hypertension, ocular migraines, hypokalemia, GERD, impaired fasting glucose, irritable bowel syndrome, anxiety, cholecystectomy, hysterectomy, tonsillectomy, appendectomy.  MEDICATIONS: 1. Losartan/HCT 100/25 mg daily. 2. Verapamil ER 240 mg q.a.m., 120 mg q.p.m. 3. Potassium 20 mEq daily. 4. Nexium 40 mg b.i.d.  ALLERGIES:  She is intolerant of VICODIN.  SOCIAL HISTORY:  She does not smoke, drink, or use drugs.  FAMILY HISTORY:  Her mother had lung cancer.  REVIEW OF SYSTEMS:  Noncontributory.  PHYSICAL EXAMINATION:  VITAL SIGNS:  Initial vital signs  revealed a temperature of 98.3 with a pulse of 127, respirations is 22, and blood pressure 151/101. GENERAL:  Alert in no distress. HEENT:  No scleral icterus.  Nose and oropharynx are unremarkable. NECK:  Supple with no JVD or thyromegaly. LUNGS:  Clear. HEART:  Regular and sinus rhythm at 76 beats per minute at the time of my exam. ABDOMEN:  Soft and nontender with no hepatosplenomegaly. EXTREMITIES:  No cyanosis, clubbing, or edema. NEUROLOGIC:  No focal deficits. LYMPH NODES:  No cervical or supraclavicular enlargement. SKIN:  Warm and dry.  LABORATORY DATA:  Sodium 139, potassium 2.5, BUN 14, creatinine 1.04, calcium 10.2, glucose 159, lipase 20, ALT 24.  White count 20.3, hemoglobin 13.7, platelets 172,000, 72 segs, 19 lymphs.  Urinalysis reveals 3-6 wbc's.  Troponin I less than 0.30.  Initial EKG revealed atrial fibrillation with a rapid ventricular response with nonspecific ST-T wave changes.  A followup EKG reveals normal sinus rhythm with minimal ST-T wave changes.  A second set of cardiac enzymes is now negative also.  Chest x-ray reveals no acute infiltrate.  IMPRESSION AND PLAN: 1. Recurrent vomiting with recent reflux worsening.  We will continue     b.i.d. PPI therapy.  Re-consult GI, she states a CT scan had been  recommended following her recent endoscopy. 2. Atrial fibrillation with a rapid ventricular response, likely     related to her electrolyte disturbance.  She has now converted to     sinus rhythm.  Potassium has been replaced.  Her repeat level is up     to 4.0.  Magnesium level is normal.  She is in sinus rhythm on     telemetry now.  Two sets of cardiac enzymes are negative.  We will     obtain a Cardiology consultation, echocardiogram, and TSH. 3. History of hypertension. 4. Ocular migraines, verapamil has been restarted. 5. Hypokalemia. 6. Impaired fasting glucose. 7. Anxiety. 8. Possible urinary tract infection.  Urine culture will be  obtained. 9. Leukocytosis.  Her vomiting may well have been triggered by a     gastroenteritis/viral syndrome.     Kingsley Callander. Ouida Sills, MD     ROF/MEDQ  D:  08/10/2012  T:  08/10/2012  Job:  [id number]

## 2012-08-10 NOTE — Progress Notes (Signed)
This encounter was created in error - please disregard.

## 2012-08-10 NOTE — Progress Notes (Signed)
INITIAL ADULT NUTRITION ASSESSMENT Date: 08/10/2012   Time: 1:53 PM Reason for Assessment: Nutrition Risk- wt loss  ASSESSMENT: Female 71 y.o.  C/o  N/V, heartburn prior to admission   Past Medical History  Diagnosis Date  . Hypertension   . Headache     Ocular Migraines  . GERD (gastroesophageal reflux disease)   . Anxiety     occasional panic attacks    Scheduled Meds:   . sodium chloride   Intravenous Once  . diltiazem  15 mg Intravenous Once  . ondansetron      . pantoprazole  40 mg Oral BID AC  . potassium chloride  10 mEq Intravenous Q1 Hr x 6  . sucralfate  2 g Oral QHS  . verapamil  240 mg Oral Daily  . DISCONTD: aspirin EC  81 mg Oral Daily  . DISCONTD: enoxaparin (LOVENOX) injection  40 mg Subcutaneous Q24H  . DISCONTD: pantoprazole  40 mg Oral Daily  . DISCONTD: potassium chloride  10 mEq Intravenous Q1 Hr x 2  . DISCONTD: sodium chloride  3 mL Intravenous Q12H   Continuous Infusions:   . sodium chloride 50 mL/hr at 08/10/12 1000  . DISCONTD: diltiazem (CARDIZEM) infusion Stopped (08/10/12 0617)   PRN Meds:.acetaminophen, acetaminophen, alum & mag hydroxide-simeth, ondansetron (ZOFRAN) IV, ondansetron  Ht: 5\' 9"  (175.3 cm)  Wt: 223 lb 1.7 oz (101.2 kg)  Ideal Wt: 66.2 kg  % Ideal Wt: 153%  Usual Wt: 232# (105kg) % Usual Wt: 96%  Body mass index is 32.95 kg/(m^2). Obesity Class I  Food/Nutrition Related Hx: Pt reports wt loss of 9#,  over past 5 weeks which she associates with her recent GI issues. She usually consumes Regular diet but has been modifying the past month to minimize symptoms eating more bland ("white" foods) she says. She is s/p EGD with polypectomy 08/07/12. Tolerating clear liquids today.   CMP     Component Value Date/Time   NA 139 08/09/2012 2210   K 4.0 08/10/2012 0651   CL 98 08/09/2012 2210   CO2 26 08/09/2012 2210   GLUCOSE 159* 08/09/2012 2210   BUN 14 08/09/2012 2210   CREATININE 1.04 08/09/2012 2210   CALCIUM 10.2  08/09/2012 2210   PROT 8.3 08/09/2012 2210   ALBUMIN 4.0 08/09/2012 2210   AST 21 08/09/2012 2210   ALT 24 08/09/2012 2210   ALKPHOS 114 08/09/2012 2210   BILITOT 0.3 08/09/2012 2210   GFRNONAA 54* 08/09/2012 2210   GFRAA 62* 08/09/2012 2210   CMP     Component Value Date/Time   NA 139 08/09/2012 2210   K 4.0 08/10/2012 0651   CL 98 08/09/2012 2210   CO2 26 08/09/2012 2210   GLUCOSE 159* 08/09/2012 2210   BUN 14 08/09/2012 2210   CREATININE 1.04 08/09/2012 2210   CALCIUM 10.2 08/09/2012 2210   PROT 8.3 08/09/2012 2210   ALBUMIN 4.0 08/09/2012 2210   AST 21 08/09/2012 2210   ALT 24 08/09/2012 2210   ALKPHOS 114 08/09/2012 2210   BILITOT 0.3 08/09/2012 2210   GFRNONAA 54* 08/09/2012 2210   GFRAA 62* 08/09/2012 2210    Intake/Output Summary (Last 24 hours) at 08/10/12 1403 Last data filed at 08/10/12 1000  Gross per 24 hour  Intake 1904.04 ml  Output    300 ml  Net 1604.04 ml    Diet Order: Clear Liquid  Supplements/Tube Feeding:none at this time  IVF:    sodium chloride Last Rate: 50 mL/hr at 08/10/12 1000  DISCONTD: diltiazem (CARDIZEM) infusion Last Rate: Stopped (08/10/12 0617)    Estimated Nutritional Needs:   Kcal:1500-1800 kcal/day Protein:65-80 gr/day Fluid:1 ml/kcal  NUTRITION DIAGNOSIS: -Inadequate oral intake (NI-2.1).  Status: Ongoing  RELATED TO: altered GI function  AS EVIDENCE BY: Nausea, vomiting and heartburn prior to admission   MONITORING/EVALUATION(Goals): Progression and tolerance of diet, adequacy of po intake Goal: Pt to meet >/= 90% of their estimated nutrition needs; not met  EDUCATION NEEDS: -Education needs addressed-discussed the importance of general healthful diet and provided examples of non-animal protein sources.  INTERVENTION: -If pt remains on clear liquids will add Resource Breeze TID to support nutritional intake while diet is being advanced. -RD will follow for nutrition needs  Dietitian [phone]  DOCUMENTATION CODES Per approved  criteria  -Obesity Unspecified    Robyn Pena, Robyn Pena 08/10/2012, 1:53 PM

## 2012-08-11 ENCOUNTER — Inpatient Hospital Stay (HOSPITAL_COMMUNITY): Payer: Medicare Other

## 2012-08-11 LAB — CBC
HCT: 35.9 % — ABNORMAL LOW (ref 36.0–46.0)
Hemoglobin: 11.4 g/dL — ABNORMAL LOW (ref 12.0–15.0)
MCV: 85.7 fL (ref 78.0–100.0)
Platelets: 96 10*3/uL — ABNORMAL LOW (ref 150–400)
RBC: 4.19 MIL/uL (ref 3.87–5.11)
WBC: 5.5 10*3/uL (ref 4.0–10.5)

## 2012-08-11 LAB — BASIC METABOLIC PANEL
CO2: 29 mEq/L (ref 19–32)
Chloride: 105 mEq/L (ref 96–112)
Creatinine, Ser: 0.84 mg/dL (ref 0.50–1.10)
Glucose, Bld: 85 mg/dL (ref 70–99)

## 2012-08-11 MED ORDER — POTASSIUM CHLORIDE CRYS ER 20 MEQ PO TBCR
40.0000 meq | EXTENDED_RELEASE_TABLET | ORAL | Status: AC
  Administered 2012-08-11 (×2): 40 meq via ORAL
  Filled 2012-08-11 (×2): qty 2

## 2012-08-11 MED ORDER — METOCLOPRAMIDE HCL 5 MG/ML IJ SOLN
10.0000 mg | Freq: Three times a day (TID) | INTRAMUSCULAR | Status: DC
  Administered 2012-08-11 – 2012-08-12 (×3): 10 mg via INTRAVENOUS
  Filled 2012-08-11 (×3): qty 2

## 2012-08-11 MED ORDER — POTASSIUM CHLORIDE 10 MEQ/100ML IV SOLN
10.0000 meq | INTRAVENOUS | Status: AC
  Administered 2012-08-11 (×4): 10 meq via INTRAVENOUS
  Filled 2012-08-11: qty 300
  Filled 2012-08-11: qty 100

## 2012-08-11 MED ORDER — IOHEXOL 300 MG/ML  SOLN
100.0000 mL | Freq: Once | INTRAMUSCULAR | Status: AC | PRN
  Administered 2012-08-11: 100 mL via INTRAVENOUS

## 2012-08-11 MED ORDER — SODIUM CHLORIDE 0.9 % IJ SOLN
INTRAMUSCULAR | Status: AC
  Filled 2012-08-11: qty 3

## 2012-08-11 MED ORDER — LOSARTAN POTASSIUM 50 MG PO TABS
100.0000 mg | ORAL_TABLET | Freq: Every day | ORAL | Status: DC
  Administered 2012-08-11 – 2012-08-12 (×2): 100 mg via ORAL
  Filled 2012-08-11 (×2): qty 2

## 2012-08-11 MED ORDER — VERAPAMIL HCL ER 240 MG PO TBCR
120.0000 mg | EXTENDED_RELEASE_TABLET | Freq: Every day | ORAL | Status: DC
  Administered 2012-08-11: 120 mg via ORAL
  Filled 2012-08-11: qty 1

## 2012-08-11 NOTE — Progress Notes (Signed)
eLink Physician-Brief Progress Note Patient Name: Robyn Pena DOB: 1941/04/05 MRN: [id number]  Date of Service  08/11/2012   HPI/Events of Note   Low k   eICU Interventions  Give       Nelda Bucks. 08/11/2012, 6:29 AM

## 2012-08-11 NOTE — Progress Notes (Signed)
CRITICAL VALUE ALERT  Critical value received: k=2'7  Date of notification: 08/11/12   Time of notification:0620  Critical value read back:yes  Nurse who received alert:Robyne Peers  MD notified (1st page):elink MD  Time of first page:  0623  MD notified (2nd page):  Time of second page:  Responding MD:   Time MD [phone]

## 2012-08-11 NOTE — Progress Notes (Signed)
UR Chart Review Completed  

## 2012-08-11 NOTE — Progress Notes (Signed)
Patient ID: DALILAH CURLIN, female   DOB: [birth year], 71 y.o.   MRN: [id number]   Patient Name: Robyn Pena Date of Encounter: 08/11/2012    SUBJECTIVE Ms. Duprey is feeling better this morning. Maintaining normal sinus rhythm. Potassium is down again to 2.7. Dr. Tyson Alias has ordered more potassium. Supplemental O2 needed during the night. She does not complaining of shortness of breath. Echocardiogram shows normal left ventricular systolic function with an EF of 55-60% with mild left atrial enlargement. She also has aortic valve thickening but no stenosis. There is a calcified annulus.  CURRENT MEDS    . losartan  100 mg Oral Daily  . pantoprazole  40 mg Oral BID AC  . potassium chloride  10 mEq Intravenous Q1 Hr x 4  . potassium chloride  40 mEq Oral Q4H  . sucralfate  2 g Oral QHS  . verapamil  240 mg Oral Daily    OBJECTIVE  Filed Vitals:   08/11/12 0500 08/11/12 0600 08/11/12 0700 08/11/12 0800  BP: 138/65 143/62 157/71   Pulse: 55 57 59   Temp:    98.1 F (36.7 C)  TempSrc:    Oral  Resp: 11 14 17    Height:      Weight:      SpO2: 98% 98% 93%     Intake/Output Summary (Last 24 hours) at 08/11/12 0906 Last data filed at 08/11/12 0657  Gross per 24 hour  Intake 1197.5 ml  Output      0 ml  Net 1197.5 ml   Filed Weights   08/09/12 2152 08/10/12 0009 08/11/12 0451  Weight: 225 lb (102.059 kg) 223 lb 1.7 oz (101.2 kg) 220 lb 3.8 oz (99.9 kg)    PHYSICAL EXAM  General: Pleasant, NAD. Neuro: Alert and oriented X 3. Moves all extremities spontaneously. Psych: Normal affect. HEENT:  Normal  Neck: Supple without bruits or JVD. Lungs:  Resp regular and unlabored, crackles in both bases Heart: RRR no s3, s4, or murmurs. Abdomen: Soft, non-tender, non-distended, BS + x 4.  Extremities: No clubbing, cyanosis or edema. DP/PT/Radials 2+ and equal bilaterally.  Accessory Clinical Findings  CBC  Basename 08/11/12 0447 08/09/12 2210  WBC 5.5 20.3*  NEUTROABS --  14.6*  HGB 11.4* 13.7  HCT 35.9* 41.7  MCV 85.7 84.6  PLT 96* 142*   Basic Metabolic Panel  Basename 08/11/12 0447 08/10/12 0651 08/09/12 2210  NA 143 -- 139  K 2.7* 4.0 --  CL 105 -- 98  CO2 29 -- 26  GLUCOSE 85 -- 159*  BUN 9 -- 14  CREATININE 0.84 -- 1.04  CALCIUM 9.2 -- 10.2  MG -- 1.8 --  PHOS -- -- --   Liver Function Tests  Basename 08/09/12 2210  AST 21  ALT 24  ALKPHOS 114  BILITOT 0.3  PROT 8.3  ALBUMIN 4.0    Basename 08/09/12 2226  LIPASE 20  AMYLASE --   Cardiac Enzymes  Basename 08/10/12 1509 08/10/12 0651 08/09/12 2241  CKTOTAL 86 75 86  CKMB 2.0 2.0 1.8  CKMBINDEX -- -- --  TROPONINI <0.30 <0.30 <0.30   BNP No components found with this basename: POCBNP:3 D-Dimer No results found for this basename: DDIMER:2 in the last 72 hours Hemoglobin A1C No results found for this basename: HGBA1C in the last 72 hours Fasting Lipid Panel No results found for this basename: CHOL,HDL,LDLCALC,TRIG,CHOLHDL,LDLDIRECT in the last 72 hours Thyroid Function Tests  Basename 08/09/12 2241  TSH 3.355  T4TOTAL --  T3FREE --  THYROIDAB --    TELE  Normal sinus rhythm with first-degree AV block  ECG    Radiology/Studies  Dg Chest Port 1 View  08/09/2012  *RADIOLOGY REPORT*  Clinical Data: Left-sided chest pain for 2 weeks.  Vomiting today.  PORTABLE CHEST - 1 VIEW  Comparison: 03/12/2011  Findings: Slightly shallow inspiration. The heart size and pulmonary vascularity are normal. The lungs appear clear and expanded without focal air space disease or consolidation. No blunting of the costophrenic angles.  No pneumothorax.  Mediastinal contours appear intact.  No significant change since previous study.  IMPRESSION: No evidence of active pulmonary disease.  Original Report Authenticated By: Marlon Pel, M.D.    ASSESSMENT AND PLAN  Active Problems:  Hypertension  GERD (gastroesophageal reflux disease)  Atrial fibrillation, new onset   Hypokalemia    She is maintaining normal sinus she has a first-degree AV block. Her atrial fib was secondary to physiological stress as well as hypokalemia. She has some crackles on exam probably from fluid resuscitation. We'll discontinue her normal saline and order incentive spirometry. She has poor GI contrast study today. Spoke to Dr. Karilyn Cota at the bedside. Recheck potassium in the morning. You'll probably need to go home on at least 40 mEq of potassium a day. We will schedule followup in the cardiology office post discharge. Her blood pressure control in the future it is important for helping maintain sinus rhythm. All questions answered. Husband, former patient of mine, at bedside.   Signed, Valera Castle MD

## 2012-08-11 NOTE — Care Management Note (Unsigned)
    Page 1 of 1   08/11/2012     1:22:28 PM   CARE MANAGEMENT NOTE 08/11/2012  Patient:  Robyn Pena, Robyn Pena   Account Number:  [id number]  Date Initiated:  08/11/2012  Documentation initiated by:  Sharrie Rothman  Subjective/Objective Assessment:   Pt admitted from home with CP, a fib with RVR. Pt lives with husband and will return home with him. Pt is independent with ADL's.     Action/Plan:   No CM needs at this time.Will continue to follow.   Anticipated DC Date:  08/13/2012   Anticipated DC Plan:  HOME/SELF CARE      DC Planning Services  CM consult      Choice offered to / List presented to:             Status of service:  In process, will continue to follow Medicare Important Message given?   (If response is "NO", the following Medicare IM given date [place] will be blank) Date Medicare IM given:   Date Additional Medicare IM given:    Discharge Disposition:    Per UR Regulation:    If discussed at Long Length of Stay Meetings, dates discussed:    Comments:  08/11/12 1321 Arlyss Queen, RN BSN CM

## 2012-08-11 NOTE — Progress Notes (Signed)
Subjective; Patient continues to belch intermittently. She is keeping contrast down for CT. She denies chest or abdominal pain. She did have a BM yesterday and small one this morning. She passed a hard stool. She denies shortness of breath. She states she was placed on O2 during the night. She denies muscle weakness, skin rash or wheezing.  Objective; BP 157/71  Pulse 59  Temp 98.1 F (36.7 C) (Oral)  Resp 17  Ht 5\' 9"  (1.753 m)  Wt 220 lb 3.8 oz (99.9 kg)  BMI 32.52 kg/m2  SpO2 93% Patient is alert and appears comfortable. Abdomen is full with normal bowel sounds. It is soft and nontender without organomegaly or masses. Lab data; Serum potassium 2.5 on admission, 4.0 yesterday and 2.7 this morning. Serum magnesium yesterday was 1.8. WBC on admission was 20.3 and this morning 5.5. Platelet count was 142K on admission and 96K this morning. Gastric polyp biopsy is pending. Assessment; Recurrent spells of belching, left-sided chest pain, nausea and vomiting. Recent EGD negative for pyloric stenosis or peptic ulcer disease. Her LFTs are normal. She has had prior cholecystectomy. She has not responded to PPI. Possibilities include biliary colic, partial small bowel obstruction or motility disorder. Symptoms not typical of carcinoid or porphyria. Hypokalemia secondary to emesis. She is receiving more KCl. Thrombocytopenia etiology unclear. Recommendations; Abdominopelvic CT with contrast today. Will review study and determine next step.

## 2012-08-11 NOTE — Progress Notes (Signed)
Abdominopelvic CT films reviewed with the patient. No abnormality noted to account for patient's symptoms. She has hepatic cysts which appear to be stable since previous study of June 2012. Focal abnormality posterior to cecum possibly scar also seen on prior study. No evidence of pancreatic abnormality or dilated biliary system. Hemoclip is still in place. It is possible we are dealing with GI motility disorder. Recommendations; Empiric trial with metoclopramide 10 mg IV a.c. and each bedtime. Patient informed of potential side effects and rationale for using this medication. She is aware that it is only short-term. Will advance diet to heart healthy diet. CBC with differential and metabolic 7 in a.m.Marland Kitchen If patient responds to metoclopramide will consider solid phase gastric emptying study on outpatient basis off promotility agent

## 2012-08-12 LAB — CBC WITH DIFFERENTIAL/PLATELET
Basophils Absolute: 0 10*3/uL (ref 0.0–0.1)
Eosinophils Relative: 2 % (ref 0–5)
HCT: 33 % — ABNORMAL LOW (ref 36.0–46.0)
Lymphocytes Relative: 50 % — ABNORMAL HIGH (ref 12–46)
Lymphs Abs: 2.2 10*3/uL (ref 0.7–4.0)
MCV: 85.9 fL (ref 78.0–100.0)
Monocytes Absolute: 0.4 10*3/uL (ref 0.1–1.0)
RDW: 15.1 % (ref 11.5–15.5)
WBC: 4.5 10*3/uL (ref 4.0–10.5)

## 2012-08-12 LAB — BASIC METABOLIC PANEL
CO2: 28 mEq/L (ref 19–32)
Calcium: 9.1 mg/dL (ref 8.4–10.5)
Creatinine, Ser: 0.8 mg/dL (ref 0.50–1.10)
Glucose, Bld: 91 mg/dL (ref 70–99)

## 2012-08-12 MED ORDER — METOCLOPRAMIDE HCL 10 MG PO TABS: 10.0000 mg | ORAL_TABLET | Freq: Three times a day (TID) | ORAL | Status: DC

## 2012-08-12 MED ORDER — LOSARTAN POTASSIUM 100 MG PO TABS: 100.0000 mg | ORAL_TABLET | Freq: Every day | ORAL | Status: DC

## 2012-08-12 MED ORDER — POTASSIUM CHLORIDE CRYS ER 20 MEQ PO TBCR
40.0000 meq | EXTENDED_RELEASE_TABLET | Freq: Two times a day (BID) | ORAL | Status: DC
  Administered 2012-08-12: 40 meq via ORAL
  Filled 2012-08-12: qty 2

## 2012-08-12 MED ORDER — POTASSIUM CHLORIDE ER 10 MEQ PO TBCR: 20.0000 meq | EXTENDED_RELEASE_TABLET | Freq: Two times a day (BID) | ORAL | Status: AC

## 2012-08-12 MED ORDER — SODIUM CHLORIDE 0.9 % IJ SOLN
INTRAMUSCULAR | Status: AC
  Administered 2012-08-12: 20 mL
  Filled 2012-08-12: qty 6

## 2012-08-12 NOTE — Progress Notes (Signed)
NAMESAHALIE, BETH                ACCOUNT NO.:  [id number]  MEDICAL RECORD NO.:  [id number]  LOCATION:  IC04                          FACILITY:  [facility]  PHYSICIAN:  Kingsley Callander. Ouida Sills, MD       DATE OF BIRTH:  [birth year]  DATE OF PROCEDURE:  08/11/2012 DATE OF DISCHARGE:                                PROGRESS NOTE   Ms. Mccants has had no further episodes of vomiting.  She denies any palpitations.  She has remained in sinus rhythm on telemetry.  PHYSICAL EXAMINATION:  VITAL SIGNS:  Her blood pressure is now normal. She has had no fever. LUNGS:  Clear. HEART:  Regular with no murmurs. ABDOMEN:  Soft, nontender.  IMPRESSION/PLAN: 1. Nausea, vomiting resolved.  She continues to feel some intermittent     belching.  A CT scan of her abdomen reveals no acute abnormality.     There is no explanation for her recent symptoms.  She has been     started on Reglan by Dr. Karilyn Cota.  White count is 5.5 with a     hemoglobin of 11.4. 2. Atrial fibrillation.  She has had no recurrent arrhythmias.  Her     echo reveals no significant valvular abnormalities or any wall     motion abnormalities.  She has normal left ventricular function.     She has been evaluated by Cardiology. 3. Hypokalemia.  Despite normalization of her potassium yesterday, she     dropped her potassium to 2.7.  She has received additional IV and     oral supplementation and will have a repeat potassium level     tomorrow. 4. Leukocytosis resolved.  White count is dropped from 20.3 to 5.5. 5. Thrombocytopenia.  Her platelet count has dropped unexpectedly from     142,000 to 96,000.  We will recheck tomorrow.     Kingsley Callander. Ouida Sills, MD     ROF/MEDQ  D:  08/11/2012  T:  08/12/2012  Job:  [id number]

## 2012-08-12 NOTE — Progress Notes (Signed)
Patient discharged home with family. Pt given discharge instructions and demonstrates understanding by teach back method. Patient is alert, oriented and in stable condition at the time of discharge.

## 2012-08-12 NOTE — Progress Notes (Signed)
Patient ID: Robyn Pena, female   DOB: 1941/07/27, 71 y.o.   MRN: [id number] S. States she is still belching.  She says as soon as she eats or drinks something she will belch. There is no pain associated with her belching.  Some acid reflux.  She has had two doses of Reglan. She says she feels restless with the Reglan, but has not helped with her belching. Appetite has not changed. No weight loss. She says she is 95% better since Sunday. No chest pain. Recommendations: Will continue to monitor.  Continue Reglan.

## 2012-08-12 NOTE — Progress Notes (Signed)
Patient reports no side effects with metoclopramide. She had a good night sleep. She denies chest pain, shortness of breath or abdominal pain. She has had a few loose stools this morning. She ate most of her breakfast without any difficulty. She is still working but less frequently. Patient appears comfortable. Abdomen is full with hyperactive bowel sounds it is soft and nontender. WBC 4.5, differential pertinent for 39% neutrophils and 50% lymphocytes. H&H 10.6 and 33. Platelet count 90 K. Serum potassium 3.4 Assessment; Acute symptoms may well be related to gastroenteritis possibly viral infections since she has developed relative neutropenia and lymphocytosis. We still have to deal with her recurrent symptoms. Symptomatically improved with metoclopramide. Diarrhea may be part and parcel of her acute illness, related to metoclopramide or secondary to contrast she drank yesterday for CT. Recommendations; Agree with with plans to discharge patient. Continue metoclopramide 10 mg by mouth a.c.. I will arrange for outpatient solid phase gastric emptying study off metoclopramide.

## 2012-08-12 NOTE — Progress Notes (Addendum)
Consulting cardiologist: Dr. Daleen Squibb  SUBJECTIVE:Feeling fine. No further incidences of Atrial fib.   Active Problems:  Atrial fibrillation, new onset  Hypertension  GERD (gastroesophageal reflux disease)  Hypokalemia   LABS: Basic Metabolic Panel:  Basename 08/12/12 0440 08/11/12 0447 08/10/12 0651  NA 142 143 --  K 3.4* 2.7* --  CL 108 105 --  CO2 28 29 --  GLUCOSE 91 85 --  BUN 7 9 --  CREATININE 0.80 0.84 --  CALCIUM 9.1 9.2 --  MG -- -- 1.8  PHOS -- -- --   Liver Function Tests:  Basename 08/09/12 2210  AST 21  ALT 24  ALKPHOS 114  BILITOT 0.3  PROT 8.3  ALBUMIN 4.0   CBC:  Basename 08/12/12 0440 08/11/12 0447 08/09/12 2210  WBC 4.5 5.5 --  NEUTROABS 1.7 -- 14.6*  HGB 10.6* 11.4* --  HCT 33.0* 35.9* --  MCV 85.9 85.7 --  PLT 90* 96* --   Cardiac Enzymes:  Basename 08/10/12 1509 08/10/12 0651 08/09/12 2241  CKTOTAL 86 75 86  CKMB 2.0 2.0 1.8  CKMBINDEX -- -- --  TROPONINI <0.30 <0.30 <0.30   Thyroid Function Tests:  Basename 08/09/12 2241  TSH 3.355  T4TOTAL --  T3FREE --  THYROIDAB --    RADIOLOGY: Ct Abdomen Pelvis W Contrast  08/11/2012  *RADIOLOGY REPORT*  Clinical Data: Nipple chest pain, vomiting, refractory GERD, past history of hysterectomy, cholecystectomy, appendectomy, hypertension, skin cancer  CT ABDOMEN AND PELVIS WITH CONTRAST  Technique:  Multidetector CT imaging of the abdomen and pelvis was performed following the standard protocol during bolus administration of intravenous contrast. Sagittal and coronal MPR images reconstructed from axial data set.  Contrast: OMNIPAQUE IOHEXOL 300 MG/ML  SOLN Dilute oral contrast.  Comparison: 06/21/2011  Findings: Minimal dependent atelectasis at bilateral lung bases. Multiple hepatic cysts, largest lateral segment left lobe 3.5 x 2.9 cm image 16. Bilateral renal cortical thinning. Tiny bilateral renal cysts. Liver, spleen, pancreas, kidneys, and adrenal glands otherwise normal appearance.  Minimal sigmoid diverticulosis. Cecum under distended, unable to exclude wall thickening at ileocecal valve region.  Large and small bowel loops otherwise unremarkable. Normal small umbilical hernia containing fat. Subtle soft tissue density posterior to the cecum on image 66, 15 x 8 mm, question lymph node versus postsurgical changes from appendectomy, unchanged. No mass, adenopathy, free fluid, or inflammatory process. No acute osseous findings. Large and small bowel loops otherwise unremarkable.  IMPRESSION: Stable soft tissue posterior to the cecum, question lymph node versus postsurgical changes related to prior appendectomy. Underdistended cecum, making it impossible to exclude wall thickening at the ileocecal valve region. Sigmoid diverticulosis. Hepatic and renal cysts. Small umbilical hernia   Original Report Authenticated By: Lollie Marrow, M.D.      PHYSICAL EXAM BP 134/60  Pulse 57  Temp 97.8 F (36.6 C) (Oral)  Resp 12  Ht 5\' 9"  (1.753 m)  Wt 219 lb 5.7 oz (99.5 kg)  BMI 32.39 kg/m2  SpO2 96% General: Well developed, well nourished, in no acute distress Head: Eyes PERRLA, No xanthomas.   Normal cephalic and atramatic  Lungs: Clear bilaterally to auscultation and percussion. Heart: HRRR S1 S2, No MRG .  Pulses are 2+ & equal.            No carotid bruit. No JVD.  No abdominal bruits. No femoral bruits. Abdomen: Bowel sounds are positive, abdomen soft and non-tender without masses or  Hernia's noted. Msk:  Back normal, normal gait. Normal strength and tone for age. Extremities: No clubbing, cyanosis or edema.  DP +1 Neuro: Alert and oriented X 3. Psych:  Good affect, responds appropriately  TELEMETRY: Reviewed telemetry pt in: NSR 1st degree AV Block ASSESSMENT AND PLAN:  1. PAF: Felt to be related to physiologic stress with hypokalemia. No need for anticoagulation at this time. This has resolved and she is stable from cardiac standpoint. Will see her as OP on  PRN basis. Continue current medications per Dr. Karilyn Cota and DrMarland Kitchen Ouida Sills. Will need to have potassium checked as OP with need to have repletion during IP status. Potassium now 3.4 on 40 mEq BID.  Robyn Pena. Robyn Bishop NP [facility] 08/12/2012, 8:58 AM  Attending note:  Reviewed recent consult by Dr. Daleen Squibb. No further atrial fibrillation. Agree with observation, and cardiac followup on an as-needed basis. We will sign off for now.  Jonelle Sidle, M.D., F.A.C.C.

## 2012-08-12 NOTE — Discharge Summary (Signed)
Robyn Pena, Robyn Pena                ACCOUNT NO.:  [id number]  MEDICAL RECORD NO.:  [id number]  LOCATION:  IC04                          FACILITY:  [facility]  PHYSICIAN:  Kingsley Callander. Ouida Sills, MD       DATE OF BIRTH:  07/03/1941  DATE OF ADMISSION:  08/09/2012 DATE OF DISCHARGE:  08/21/2013LH                              DISCHARGE SUMMARY   DISCHARGE DIAGNOSES: 1. Recurrent nausea and vomiting. 2. Gastroesophageal reflux disease. 3. Possible gastroparesis. 4. Hypokalemia. 5. Paroxysmal atrial fibrillation. 6. Ocular migraines. 7. Hypertension.  DISCHARGE MEDICATIONS: 1. Nexium 40 mg b.i.d. 2. Imodium 2 mg p.r.n. 3. Calan SR 240 mg q.a.m., 120 mg q.p.m. 4. Aspirin 81 mg daily. 5. Losartan 100 mg daily. 6. Reglan 10 mg t.i.d. a.c. 7. Potassium 20 mEq b.i.d. 8. Carafate 2 g daily. 9. Compazine 10 mg q.6 p.r.n.  HOSPITAL COURSE:  This patient is a 71 year old white female who presented to the emergency room after having a several day period of increased belching.  She then developed recurrent episodes of vomiting and persistent nausea.  On presentation, she was found to be hypokalemic at 2.5.  She was in atrial fibrillation with rapid ventricular response. She was treated with diltiazem.  She spontaneously converted to sinus rhythm and maintained sinus rhythm throughout her hospitalization.  She was seen in cardiology consultation.  Her echocardiogram revealed normal left ventricular function, and no wall motion abnormalities.  Her potassium normalized to 4.0 with IV supplementation.  Her potassium, however, dropped the next day to 2.7 and required additional IV and oral supplementation to 3.4 prior to discharge.  She had persistent abdominal symptoms of belching, but no further vomiting after the initial night.  She was seen in gastroenterology consultation.  She had recently undergone an EGD.  She has been on PPI therapy.  She had had a polyp removed, which was benign.  She  underwent a CT scan of the abdomen, which revealed no evidence of acute process. She was felt to possibly have a motility problem and was started on Reglan 10 mg before meals.  She will be discharged on oral Reglan 10 mg before meals, and will have a follow up evaluation of her stomach next week by Gastroenterology.  Her condition at discharge is much improved.  She has been able to eat without difficulty.  She will continue potassium 20 mEq b.i.d.  Hydrochlorothiazide in the form of Hizaar was stopped.  She will be continued on losartan and verapamil for her hypertension.  She will continue PPI therapy with Nexium b.i.d.  She will be seen in followup in my office in 1 week.     Kingsley Callander. Ouida Sills, MD     ROF/MEDQ  D:  08/12/2012  T:  08/12/2012  Job:  [id number]

## 2012-08-13 ENCOUNTER — Other Ambulatory Visit (INDEPENDENT_AMBULATORY_CARE_PROVIDER_SITE_OTHER): Payer: Self-pay | Admitting: Internal Medicine

## 2012-08-13 DIAGNOSIS — R1110 Vomiting, unspecified: Secondary | ICD-10-CM

## 2012-08-13 DIAGNOSIS — R142 Eructation: Secondary | ICD-10-CM

## 2012-08-20 ENCOUNTER — Encounter (HOSPITAL_COMMUNITY): Payer: Self-pay

## 2012-08-20 ENCOUNTER — Encounter (HOSPITAL_COMMUNITY)
Admission: RE | Admit: 2012-08-20 | Discharge: 2012-08-20 | Disposition: A | Discharge status: Home / Self Care | Payer: Medicare Other | Source: Ambulatory Visit | Attending: Internal Medicine | Admitting: Internal Medicine

## 2012-08-20 ENCOUNTER — Encounter (INDEPENDENT_AMBULATORY_CARE_PROVIDER_SITE_OTHER): Payer: Self-pay | Admitting: *Deleted

## 2012-08-20 DIAGNOSIS — R1110 Vomiting, unspecified: Secondary | ICD-10-CM | POA: Insufficient documentation

## 2012-08-20 DIAGNOSIS — R142 Eructation: Secondary | ICD-10-CM | POA: Insufficient documentation

## 2012-08-20 DIAGNOSIS — R141 Gas pain: Secondary | ICD-10-CM | POA: Insufficient documentation

## 2012-08-20 MED ORDER — TECHNETIUM TC 99M SULFUR COLLOID
2.0000 | Freq: Once | INTRAVENOUS | Status: AC | PRN
  Administered 2012-08-20: 2.2 via INTRAVENOUS

## 2012-08-25 ENCOUNTER — Other Ambulatory Visit (INDEPENDENT_AMBULATORY_CARE_PROVIDER_SITE_OTHER): Payer: Self-pay | Admitting: Internal Medicine

## 2012-08-25 DIAGNOSIS — R1110 Vomiting, unspecified: Secondary | ICD-10-CM

## 2012-08-25 DIAGNOSIS — R142 Eructation: Secondary | ICD-10-CM

## 2012-08-27 ENCOUNTER — Other Ambulatory Visit (HOSPITAL_COMMUNITY): Payer: Medicare Other

## 2012-09-03 ENCOUNTER — Ambulatory Visit (HOSPITAL_COMMUNITY)
Admission: RE | Admit: 2012-09-03 | Discharge: 2012-09-03 | Disposition: A | Discharge status: Home / Self Care | Payer: Medicare Other | Source: Ambulatory Visit | Attending: Internal Medicine | Admitting: Internal Medicine

## 2012-09-03 DIAGNOSIS — R141 Gas pain: Secondary | ICD-10-CM | POA: Insufficient documentation

## 2012-09-03 DIAGNOSIS — R1110 Vomiting, unspecified: Secondary | ICD-10-CM | POA: Insufficient documentation

## 2012-09-03 DIAGNOSIS — R143 Flatulence: Secondary | ICD-10-CM | POA: Insufficient documentation

## 2012-09-03 DIAGNOSIS — R142 Eructation: Secondary | ICD-10-CM | POA: Insufficient documentation

## 2012-09-10 NOTE — Progress Notes (Signed)
Apt has been scheduled for 11/03/12 with Dr. Karilyn Cota.

## 2012-10-01 ENCOUNTER — Other Ambulatory Visit: Payer: Self-pay | Admitting: Internal Medicine

## 2012-10-01 DIAGNOSIS — Z1231 Encounter for screening mammogram for malignant neoplasm of breast: Secondary | ICD-10-CM

## 2012-10-08 ENCOUNTER — Encounter (INDEPENDENT_AMBULATORY_CARE_PROVIDER_SITE_OTHER): Payer: Self-pay

## 2012-11-03 ENCOUNTER — Ambulatory Visit (INDEPENDENT_AMBULATORY_CARE_PROVIDER_SITE_OTHER): Payer: Medicare Other | Admitting: Internal Medicine

## 2012-11-03 ENCOUNTER — Encounter (INDEPENDENT_AMBULATORY_CARE_PROVIDER_SITE_OTHER): Payer: Self-pay | Admitting: Internal Medicine

## 2012-11-03 VITALS — BP 140/80 | HR 76 | Temp 97.8°F | Resp 20 | Ht 68.0 in | Wt 220.8 lb

## 2012-11-03 DIAGNOSIS — R112 Nausea with vomiting, unspecified: Secondary | ICD-10-CM | POA: Insufficient documentation

## 2012-11-03 NOTE — Progress Notes (Signed)
Presenting complaint;  Followup for spells of nausea chest pain vomiting and diarrhea.  Subjective:  Patient is 71 year old Caucasian female patient of Dr. Ouida Sills who is here for scheduled visit. She was last seen on 08/09/2012. She does not feel any better. Prior to that visit she was hospitalized and begun on metoclopramide. She did not have any side effects with this medication but it did not work. She also had solid phase gastric emptying study without medication and was within normal limits. She also had normal small bowel study in September, 2013. She can't tell any difference with lorazepam or Carafate. He continues to have intermittent symptoms. One such episode occurred on 09/20/2012 while she was vacationing in [place]. [place]. She woke up sick 4 AM in the morning with burping and nausea and then developed diarrhea. She cut her visit shot and came home. She took Compazine and felt better. Every time she experiences chest pain she gets scared wondering if it is her heart. In between these spells her appetite is normal she is eating bland foods. She has lost 4 pounds since her last visit.   Current Medications: Current Outpatient Prescriptions  Medication Sig Dispense Refill  . aspirin 81 MG chewable tablet Chew 81 mg by mouth at bedtime. Patient had stopped med for a procedure, and is not supposed to start back until 08/15/2012      . Ibuprofen (ADVIL) 200 MG CAPS Take by mouth as needed.      . loperamide (IMODIUM) 2 MG capsule Take 2 mg by mouth 4 (four) times daily as needed. Diarrhea      . LORazepam (ATIVAN) 1 MG tablet Take 1 mg by mouth. Patient takes 1/2 tablet in the morning , and 1/2 tablet in the evening      . losartan (COZAAR) 100 MG tablet Take 1 tablet (100 mg total) by mouth daily.  90 tablet  3  . omeprazole (PRILOSEC) 20 MG capsule Take 20 mg by mouth 2 (two) times daily.      . potassium chloride (K-DUR) 10 MEQ tablet Take 2 tablets (20 mEq total) by mouth 2 (two) times daily.   60 tablet  1  . prochlorperazine (COMPAZINE) 10 MG tablet Take 10 mg by mouth as needed. For nausea      . verapamil (CALAN-SR) 120 MG CR tablet Take 120 mg by mouth at bedtime.      . metoCLOPramide (REGLAN) 10 MG tablet Take 1 tablet (10 mg total) by mouth 3 (three) times daily before meals.  30 tablet  3  . sucralfate (CARAFATE) 1 G tablet Take 2 tablets (2 g total) by mouth at bedtime.  60 tablet  2  . [DISCONTINUED] verapamil (CALAN-SR) 240 MG CR tablet Take 240 mg by mouth daily.         Objective: Blood pressure 140/80, pulse 76, temperature 97.8 F (36.6 C), temperature source Oral, resp. rate 20, height 5\' 8"  (1.727 m), weight 220 lb 12.8 oz (100.154 kg). Patient is alert and in no acute distress Conjunctiva is pink. Sclera is nonicteric Oropharyngeal mucosa is normal. No neck masses or thyromegaly noted. Cardiac exam with regular rhythm normal S1 and S2. No murmur or gallop noted. Lungs are clear to auscultation. Abdomen. Bowel sounds are normal. Abdomen is soft and nontender without organomegaly or masses.  No LE edema or clubbing noted.  Labs/studies Results: Gastric emptying study performed 08/27/2012 and she had 6% of 70 and 120 min. Small bowel follow normal on 09/18/213.  Assessment:  Patient remains with episodic nausea burping chest pain as well as vomiting and diarrhea. She has undergone extensive evaluation including EGD, abdominopelvic CT,  gastric emptying study and small bowel follow-through all of which been unremarkable. H. pylori serology was negative. She has not responded to PPI sucralfate and lorazepam. 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 Symptoms are not typical of acute intermittent porphyria. I wonder if her symptoms are secondary to losartan.   Plan:  Decrease lorazepam to 0.25 mg twice a day for one week and stop. Will ask Dr. Ouida Sills if she could be switched to another antihypertensive in place of losartan. If symptoms persist  will consider low-dose TCA prior to surgery Center referable. Patient will call with progress report in one month.

## 2012-11-03 NOTE — Patient Instructions (Signed)
Decrease lorazepam to 1/4 th of a pill twice daily for a week and stop. Please check with Dr. Ouida Sills if you could be switched from losartan to another blood pressure medication

## 2012-11-17 ENCOUNTER — Ambulatory Visit
Admission: RE | Admit: 2012-11-17 | Discharge: 2012-11-17 | Disposition: A | Discharge status: Home / Self Care | Payer: Medicare Other | Source: Ambulatory Visit | Attending: Internal Medicine | Admitting: Internal Medicine

## 2012-11-17 DIAGNOSIS — Z1231 Encounter for screening mammogram for malignant neoplasm of breast: Secondary | ICD-10-CM

## 2013-01-05 ENCOUNTER — Ambulatory Visit (INDEPENDENT_AMBULATORY_CARE_PROVIDER_SITE_OTHER): Payer: Medicare Other | Admitting: Internal Medicine

## 2013-03-17 ENCOUNTER — Encounter (INDEPENDENT_AMBULATORY_CARE_PROVIDER_SITE_OTHER): Payer: Self-pay

## 2013-04-05 ENCOUNTER — Ambulatory Visit (HOSPITAL_COMMUNITY)
Admission: RE | Admit: 2013-04-05 | Discharge: 2013-04-05 | Disposition: A | Discharge status: Home / Self Care | Payer: Medicare Other | Source: Ambulatory Visit | Attending: Internal Medicine | Admitting: Internal Medicine

## 2013-04-05 ENCOUNTER — Other Ambulatory Visit (HOSPITAL_COMMUNITY): Payer: Self-pay | Admitting: Internal Medicine

## 2013-04-05 DIAGNOSIS — R05 Cough: Secondary | ICD-10-CM

## 2013-04-05 DIAGNOSIS — I10 Essential (primary) hypertension: Secondary | ICD-10-CM | POA: Insufficient documentation

## 2013-04-05 DIAGNOSIS — R059 Cough, unspecified: Secondary | ICD-10-CM | POA: Insufficient documentation

## 2013-06-01 ENCOUNTER — Ambulatory Visit (INDEPENDENT_AMBULATORY_CARE_PROVIDER_SITE_OTHER): Payer: Medicare Other | Admitting: Adult Health

## 2013-06-01 ENCOUNTER — Encounter: Payer: Self-pay | Admitting: Adult Health

## 2013-06-01 VITALS — BP 130/80 | Ht 69.0 in | Wt 225.0 lb

## 2013-06-01 DIAGNOSIS — S20212A Contusion of left front wall of thorax, initial encounter: Secondary | ICD-10-CM

## 2013-06-01 DIAGNOSIS — S20219A Contusion of unspecified front wall of thorax, initial encounter: Secondary | ICD-10-CM

## 2013-06-01 NOTE — Patient Instructions (Addendum)
Try advil and use ice or heat Follow up in 8 days

## 2013-06-01 NOTE — Progress Notes (Signed)
Subjective:     Patient ID: Robyn Pena, female   DOB: 01/18/1941, 72 y.o.   MRN: [id number]  HPI Robyn Pena is in today complaining of pain in left breast and left ribs.She missed a step and fill into a handrail and wall on 05/22/13, while visit in [place] and has discomfort since, some days more the others.Maybe 3 out of 10 on the pain scale. She had a normal mammogram 11/18/12 and a normal chest xray 04/05/13. Did feel better after some advil.She has not used ice or heat. She said she has gained 8-10 pounds recently her husband was diagnosed with cancer.  Review of Systems Positives as in HPI  Reviewed past medical,surgical, social and family history. Reviewed medications and allergies.     Objective:   Physical Exam BP 130/80  Ht 5\' 9"  (1.753 m)  Wt 225 lb (102.059 kg)  BMI 33.21 kg/m2   On breast exam there is no dominant mass, retraction or nipple discharge, no bruising noted, no bruising of ribs, no swelling a little tenderness, but not much.Can take a deep breath without any pain. No CVAT.  Assessment:      Left breast and chest wall contusion    Plan:       Try advil and ice or heat and will recheck in 8 days, if discomfort persists will xray ribs.

## 2013-06-08 ENCOUNTER — Encounter: Payer: Self-pay | Admitting: *Deleted

## 2013-06-09 ENCOUNTER — Encounter: Payer: Self-pay | Admitting: Adult Health

## 2013-06-09 ENCOUNTER — Ambulatory Visit (INDEPENDENT_AMBULATORY_CARE_PROVIDER_SITE_OTHER): Payer: Medicare Other | Admitting: Adult Health

## 2013-06-09 VITALS — BP 170/80 | Ht 69.0 in | Wt 225.0 lb

## 2013-06-09 DIAGNOSIS — S20219A Contusion of unspecified front wall of thorax, initial encounter: Secondary | ICD-10-CM

## 2013-06-09 DIAGNOSIS — S20212D Contusion of left front wall of thorax, subsequent encounter: Secondary | ICD-10-CM

## 2013-06-09 NOTE — Progress Notes (Signed)
Subjective:     Patient ID: Robyn Pena, female   DOB: October 23, 1941, 72 y.o.   MRN: [id number]  HPI Anastacia is back in follow up of left chest wall contusion.She is feeling better, no pain. She does say her left foot hurts, she walked yesterday with scandals on in [place]. And had swelling yesterday.  Review of Systems Positives in HPI Reviewed past medical,surgical, social and family history. Reviewed medications and allergies.     Objective:   Physical Exam BP 170/80  Ht 5\' 9"  (1.753 m)  Wt 225 lb (102.059 kg)  BMI 33.21 kg/m2   skin warm and dry, no pain in left ribs or breast, no color changes in skin. She has some mild bruising and swelling top of left foot, good pulses and no pain with flexing toes. The right foot has similar coloring but no pain. Assessment:      Resolved pain from chest wall contusion Pain in left foot    Plan:      Rest and ice foot if not better see PCP Call prn problems

## 2013-06-09 NOTE — Patient Instructions (Addendum)
Follow up prn

## 2013-06-24 ENCOUNTER — Other Ambulatory Visit: Payer: Self-pay | Admitting: Dermatology

## 2013-07-02 ENCOUNTER — Encounter (HOSPITAL_COMMUNITY): Payer: Self-pay

## 2013-07-02 ENCOUNTER — Emergency Department (HOSPITAL_COMMUNITY): Payer: Medicare Other

## 2013-07-02 ENCOUNTER — Emergency Department (HOSPITAL_COMMUNITY)
Admission: EM | Admit: 2013-07-02 | Discharge: 2013-07-02 | Disposition: A | Discharge status: Home / Self Care | Payer: Medicare Other | Attending: Emergency Medicine | Admitting: Emergency Medicine

## 2013-07-02 DIAGNOSIS — Z87891 Personal history of nicotine dependence: Secondary | ICD-10-CM | POA: Insufficient documentation

## 2013-07-02 DIAGNOSIS — F411 Generalized anxiety disorder: Secondary | ICD-10-CM | POA: Insufficient documentation

## 2013-07-02 DIAGNOSIS — I10 Essential (primary) hypertension: Secondary | ICD-10-CM | POA: Insufficient documentation

## 2013-07-02 DIAGNOSIS — Z79899 Other long term (current) drug therapy: Secondary | ICD-10-CM | POA: Insufficient documentation

## 2013-07-02 DIAGNOSIS — K219 Gastro-esophageal reflux disease without esophagitis: Secondary | ICD-10-CM | POA: Insufficient documentation

## 2013-07-02 DIAGNOSIS — R079 Chest pain, unspecified: Secondary | ICD-10-CM

## 2013-07-02 DIAGNOSIS — M542 Cervicalgia: Secondary | ICD-10-CM | POA: Insufficient documentation

## 2013-07-02 DIAGNOSIS — Z9889 Other specified postprocedural states: Secondary | ICD-10-CM | POA: Insufficient documentation

## 2013-07-02 DIAGNOSIS — R0789 Other chest pain: Secondary | ICD-10-CM | POA: Insufficient documentation

## 2013-07-02 DIAGNOSIS — Z7982 Long term (current) use of aspirin: Secondary | ICD-10-CM | POA: Insufficient documentation

## 2013-07-02 DIAGNOSIS — Z8679 Personal history of other diseases of the circulatory system: Secondary | ICD-10-CM | POA: Insufficient documentation

## 2013-07-02 LAB — CBC WITH DIFFERENTIAL/PLATELET
Eosinophils Relative: 2 % (ref 0–5)
HCT: 36.6 % (ref 36.0–46.0)
Hemoglobin: 11.9 g/dL — ABNORMAL LOW (ref 12.0–15.0)
Lymphocytes Relative: 36 % (ref 12–46)
Lymphs Abs: 2.3 10*3/uL (ref 0.7–4.0)
MCV: 85.7 fL (ref 78.0–100.0)
Monocytes Absolute: 0.7 10*3/uL (ref 0.1–1.0)
Monocytes Relative: 11 % (ref 3–12)
Neutro Abs: 3.3 10*3/uL (ref 1.7–7.7)
WBC: 6.4 10*3/uL (ref 4.0–10.5)

## 2013-07-02 LAB — BASIC METABOLIC PANEL
CO2: 27 mEq/L (ref 19–32)
Chloride: 103 mEq/L (ref 96–112)
GFR calc Af Amer: >90 mL/min (ref >90)
Potassium: 3.8 mEq/L (ref 3.5–5.1)

## 2013-07-02 NOTE — ED Notes (Signed)
Right sided chest pain, pain in right neck and right side of face. A lot of deep burping per pt.

## 2013-07-02 NOTE — ED Provider Notes (Signed)
History    This chart was scribed for Robyn Hutching, MD, by Frederik Pear, ED scribe. The patient was seen in room APA15/AP[facility]15 and the patient's care was started at 2030.   CSN: [id number] Arrival date & time 07/02/13  1919  First MD Initiated Contact with Patient 07/02/13 2030     Chief Complaint  Patient presents with  . Chest Pain  . Neck Pain   (Consider location/radiation/quality/duration/timing/severity/associated sxs/prior Treatment) The history is provided by the patient and medical records. No language interpreter was used.    HPI Comments: RASHAE Pena is a 72 y.o. female with a h/o of Afib and GERD who presents to the Emergency Department complaining of gradually improving, constant, right-sided, crampy, "fluttering" CP with associated burping and right-sided neck pain that began while she was at rest at 15:50. Denies SOB, diaphoresis, nausea, and emesis. She attempted to alleviate the symptoms by taking Tums, but is unsure if the symptoms are improving due to the medication or with time. She reports 1 week ago that she experienced left-sided chest pain with burping and was seen by her PCP and diagnosed with a weak esophagus and changed her GERD treatment. She reports she took the first dose of the new medication last night. She denies any episodes of pain between last week, and the pain that began today. Ambulatory since the onset of symptoms. She reports she was hospitalized in 08/13 for Afib, but denies any recurrent episodes since the hospitalization. She has a h/o of hypertension, and her BP runs 145/85 at baseline. No family h/o of heart disease. No h/o of hyperlipidemia or cardiac concerns. She had a GI endoscopy performed in 07/13 and a polyp was removed. She had stress test performed, which was normal. She is a nonsmoker who does not consume ETOH. Her husband was diagnosed with lung cancer in 03/14. She is not established with a cardiologist.   PCP is Dr. Ouida Sills.   Past  Medical History  Diagnosis Date  . Hypertension   . Headache(784.0)     Ocular Migraines  . GERD (gastroesophageal reflux disease)   . Anxiety     occasional panic attacks   Past Surgical History  Procedure Laterality Date  . Abdominal hysterectomy    . Cyst removed from cervix    . Colonoscopy  05/28/2012    Procedure: COLONOSCOPY;  Surgeon: Malissa Hippo, MD;  Location: [facility];  Service: Endoscopy;  Laterality: N/A;  830  . Cholecystectomy      2002  . Appendectomy    . Upper gastrointestinal endoscopy  08/07/12    with polyp removal   Family History  Problem Relation Age of Onset  . Lung cancer Mother   . Healthy Father   . Cancer Father     skin cancer amp leg, died at 29   History  Substance Use Topics  . Smoking status: Former Smoker -- 2.00 packs/day for 9 years    Types: Cigarettes    Quit date: 11/03/1981  . Smokeless tobacco: Never Used  . Alcohol Use: No   OB History   Grav Para Term Preterm Abortions TAB SAB Ect Mult Living   2 2        2      Review of Systems A complete 10 system review of systems was obtained and all systems are negative except as noted in the HPI and PMH.  Allergies  Hydrocodone and Tape  Home Medications   Current Outpatient Rx  Name  Route  Sig  Dispense  Refill  . aspirin 81 MG EC tablet   Oral   Take 81 mg by mouth daily. Swallow whole.         . Ibuprofen (ADVIL) 200 MG CAPS   Oral   Take by mouth as needed.         . loperamide (IMODIUM) 2 MG capsule   Oral   Take 2 mg by mouth 4 (four) times daily as needed. Diarrhea         . LORazepam (ATIVAN) 1 MG tablet   Oral   Take 1 mg by mouth. Patient takes 1/2 tablet in the morning , and 1/2 tablet in the evening         . metoprolol tartrate (LOPRESSOR) 25 MG tablet   Oral   Take 25 mg by mouth 2 (two) times daily.         Marland Kitchen omeprazole (PRILOSEC) 20 MG capsule   Oral   Take 20 mg by mouth 2 (two) times daily.         . ranitidine (ZANTAC) 300  MG tablet   Oral   Take 300 mg by mouth at bedtime.         . traZODone (DESYREL) 50 MG tablet   Oral   Take 50 mg by mouth at bedtime.         . verapamil (CALAN-SR) 120 MG CR tablet   Oral   Take 120 mg by mouth at bedtime. Patient takes 240 mg by mouth in the morning         . potassium chloride (K-DUR) 10 MEQ tablet   Oral   Take 2 tablets (20 mEq total) by mouth 2 (two) times daily.   60 tablet   1    BP 209/77  Pulse 62  Temp(Src) 98.2 F (36.8 C) (Oral)  Resp 20  Ht 5\' 9"  (1.753 m)  Wt 220 lb (99.791 kg)  BMI 32.47 kg/m2  SpO2 97% Physical Exam  Nursing note and vitals reviewed. Constitutional: She is oriented to person, place, and time. She appears well-developed and well-nourished.  HENT:  Head: Normocephalic and atraumatic.  Eyes: Conjunctivae and EOM are normal. Pupils are equal, round, and reactive to light.  Neck: Normal range of motion. Neck supple.  Tender muscles right lateral neck  Cardiovascular: Normal rate, regular rhythm and normal heart sounds.   Pulmonary/Chest: Effort normal and breath sounds normal.  Abdominal: Soft. Bowel sounds are normal.  Musculoskeletal: Normal range of motion.  Neurological: She is alert and oriented to person, place, and time.  Skin: Skin is warm and dry.  Psychiatric: She has a normal mood and affect.    ED Course  Procedures (including critical care time)  DIAGNOSTIC STUDIES: Oxygen Saturation is 97% on room air, normal by my interpretation.    COORDINATION OF CARE:  21:10- Discussed planned course of treatment with the patient, including a chest X-ray, EKG, and blood work, who is agreeable at this time.  Labs Reviewed  BASIC METABOLIC PANEL - Abnormal; Notable for the following:    GFR calc non Af Amer 83 (*)    All other components within normal limits  CBC WITH DIFFERENTIAL - Abnormal; Notable for the following:    Hemoglobin 11.9 (*)    Platelets 128 (*)    All other components within normal  limits  TROPONIN I   Dg Chest Port 1 View  07/02/2013   *RADIOLOGY REPORT*  Clinical Data: Chest pain,  neck pain, and face pain.  PORTABLE CHEST - 1 VIEW  Comparison: 04/05/2013  Findings: Borderline heart size is probably normal for AP technique.  No vascular congestion.  No focal airspace disease in the lungs.  No blunting of costophrenic angles.  No pneumothorax. Mediastinal contours appear intact.  Degenerative changes in the spine.  No significant change since previous study.  IMPRESSION: No evidence of active pulmonary disease.   Original Report Authenticated By: Burman Nieves, M.D.     Date: 07/02/2013  Rate: 57  Rhythm: sinus brady  QRS Axis: normal  Intervals: normal  ST/T Wave abnormalities: normal  Conduction Disutrbances: none  Narrative Interpretation: unremarkable    No diagnosis found.  MDM  Chest pain is atypical for acute coronary syndrome or pulmonary embolism. Screening tests normal. Patient has primary care followup  I personally performed the services described in this documentation, which was scribed in my presence. The recorded information has been reviewed and is accurate.   Robyn Hutching, MD 07/02/13 [phone]

## 2013-07-28 ENCOUNTER — Encounter: Payer: Self-pay | Admitting: Cardiology

## 2013-08-24 ENCOUNTER — Encounter: Payer: Self-pay | Admitting: Cardiology

## 2013-08-24 NOTE — Progress Notes (Signed)
Patient ID: Robyn Pena, female   DOB: [birth year], 72 y.o.   MRN: [id number]  Benedetto Coons    Date of visit:  08/24/2013 DOB:  1941-08-08    Age:  71 yrs. Medical record number:  [id number]     Account number:  [id number] Primary Care Provider: Carylon Perches ____________________________ CURRENT DIAGNOSES  1. Chest Pain  2. Hypertension,Essential (Benign)  3. GERD  4. Sleep Apnea  5. Obesity(BMI30-40)  6. Common Migraine Without Mention Of Intractable Migraine ____________________________ ALLERGIES  Vicodin, Nausea ____________________________ MEDICATIONS  1. trazodone 50 mg tablet, QHS  2. metoprolol tartrate 25 mg tablet, BID  3. aspirin 81 mg tablet,chewable, 1 p.o. daily  4. lorazepam 1 mg tablet, PRN  5. Klor-Con M20 20 mEq tablet,ER particles/crystals, 1 p.o. b.i.d.  6. Zantac 300 mg tablet, PRN  7. Biotin 100+10 800-195 mcg-mg tablet, BID  8. Advil 100 mg tablet, PRN  9. nitroglycerin 0.4 mg tablet, sublingual, 1 p.o. daily  10. omeprazole 20 mg capsule,delayed release(DR/EC), BID  11. verapamil 120 mg tablet extended release, 2 qam  1 qpm ____________________________ CHIEF COMPLAINTS  Followup of Chest Pain ____________________________ HISTORY OF PRESENT ILLNESS  Patient seen for cardiac followup. She had a negative myocardial perfusion scan. She denies recurrent chest pain since she was here but complains that her hair is falling out. Her blood pressure remains up a little bit. She denies recurrent chest pain and has not had to use nitroglycerin since she was here. She remains overweight. She is under some stress with her husband who has significant cancer issues. She denies PND, orthopnea or claudication. ____________________________ PAST HISTORY  Past Medical Illnesses:  hypertension, GERD, anxiety, irritable bowel syndrome, sleep apnea, obesity;  Cardiovascular Illnesses:  atrial fibrillation-paroxysmal;  Surgical Procedures:  appendectomy, hysterectomy, tonsillectomy, app;   Cardiology Procedures-Invasive:  no previous interventional or invasive cardiology procedures;  Cardiology Procedures-Noninvasive:  treadmill cardiolite April 2012, echocardiogram August 2013;  LVEF of 75% documented via nuclear study on 08/12/2013,   ____________________________ Jacqulyn Bath TEST DATES EKG Date:  07/28/2013;  Nuclear Study Date:  03/27/2011;  Echocardiography Date: 08/10/2012;  Chest Xray Date: 07/02/2013;   ____________________________ SOCIAL HISTORY Alcohol Use:  does not use alcohol;  Smoking:  used to smoke but quit, 25 pack year history;  Diet:  regular diet;  Lifestyle:  married;  Exercise:  no regular exercise;  Residence:  lives with husband;   ____________________________ REVIEW OF SYSTEMS General:  obesity Eyes: wears eye glasses/contact lenses Respiratory: mild dyspnea with exertion Cardiovascular:  please review HPI Abdominal: see HPI Musculoskeletal:  generalized arthritis Neurological:  ocular migraines Psychiatric:  situational stress  ____________________________ PHYSICAL EXAMINATION VITAL SIGNS  Blood Pressure:  170/90 Sitting, Right arm, large cuff   Pulse:  64/min. Weight:  230.00 lbs. Height:  69"BMI: 34  Constitutional:  pleasant white female, in no acute distress, moderately obese Skin:  warm and dry to touch, no apparent skin lesions, or masses noted. Head:  normocephalic, normal hair pattern, no masses or tenderness Neck:  supple, without massess. No JVD, thyromegaly or carotid bruits. Carotid upstroke normal. Chest:  normal symmetry, clear to auscultation and percussion. Cardiac:  regular rhythm, normal S1 and S2, No S3 or S4, no murmurs, gallops or rubs detected. Peripheral Pulses:  the femoral,dorsalis pedis, and posterior tibial pulses are full and equal bilaterally with no bruits auscultated. Extremities & Back:  no deformities, clubbing, cyanosis, erythema or edema observed. Normal muscle strength and tone. Neurological:  no gross motor or  sensory deficits  noted, affect appropriate, oriented x3. ____________________________ IMPRESSIONS/PLAN  1. Intermittent chest discomfort with a negative myocardial perfusion scan. Evidently there is a history of possible esophageal spasm documented at [facility] in the past. 2. Obesity 3. Hypertension not controlled today 4. Hair loss of uncertain cause  Recommendations:  At this point with a negative myocardial perfusion scan I would recommend empiric treatment of the chest pain. She is concerned about hair loss and I suggested that she follow this up with her primary physician starting with elimination of medications that have been most recently added. I told her that it would be difficult to tell what the causative agent was without development of a plan to determine what might possibly be causing it which would involve selectively eliminating  medications one at a time.  She had some transient nonsustained atrial fibrillation during exercise but quickly reverted to sinus and recovery. If she has evidence of atrial fibrillation in the future she might need to have more intense anticoagulation.   I will see her in followup in 4 months. I asked her to use nitroglycerin as needed for chest pain although she has not really had any recently. ____________________________ TODAYS ORDERS  1. Return Visit: 4 months                       ____________________________ Cardiology Physician:  Darden Palmer MD [facility]

## 2013-08-24 NOTE — Progress Notes (Signed)
Patient ID: Robyn Pena, female   DOB: May 06, 1941, 72 y.o.   MRN: [id number]  Benedetto Coons    Date of visit:  07/28/2013 DOB:  March 06, 1941    Age:  72 yrs. Medical record number:  [id number]     Account number:  [id number] Primary Care Provider: Carylon Perches ____________________________ CURRENT DIAGNOSES  1. Chest Pain  2. Hypertension,Essential (Benign)  3. GERD  4. Sleep Apnea  5. Obesity(BMI30-40)  6. Common Migraine Without Mention Of Intractable Migraine ____________________________ ALLERGIES  Vicodin, Nausea ____________________________ MEDICATIONS  1. trazodone 50 mg tablet, QHS  2. metoprolol tartrate 25 mg tablet, BID  3. aspirin 81 mg tablet,chewable, 1 p.o. daily  4. verapamil 120 mg tablet extended release, QHS  5. lorazepam 1 mg tablet, PRN  6. Klor-Con M20 20 mEq tablet,ER particles/crystals, 1 p.o. b.i.d.  7. omeprazole 20 mg tablet,delayed release (DR/EC)  8. Zantac 300 mg tablet, PRN  9. Biotin 100+10 800-195 mcg-mg tablet, BID  10. Advil 100 mg tablet, PRN  11. lorazepam 0.5 mg tablet, 1/2 tab daily  12. nitroglycerin 0.4 mg tablet, sublingual, 1 p.o. daily ____________________________ HISTORY OF PRESENT ILLNESS  This very nice 72 year old female is seen at request of Dr. Ouida Sills for evaluation of chest pain. The patient has a long-standing history of hypertension and has had significant chest pain for the past 3 or 4 years. She had an admission to the hospital with some chest pain and atrial fibrillation that resolved. She in the past has had a negative myocardial perfusion scan and has also had an echocardiogram that was unremarkable. She describes the discomfort as intermittent and upper left-sided pressure type feeling with occasionally some radiation into her arm. It will occur intermittently every several months. She states that she has been told that she has significant esophageal reflux and at some point esophageal spasm was mentioned by physicians at [facility].  She had a recurrent episode recently in May and was seen in the emergency room and discharged. She has not had any severe recurrence of discomfort since then. She described that pain associated with sweating and some radiation up into her neck and some mild shortness of breath that eventually resolved. She also has a history of ocular migraines and has been treated with low-dose verapamil for this as well as blood pressure. She has a remote history of sleep apnea but does not use CPAP. She has been obese for several years. The discomfort is not exertional. She does not get much in the way of regular exercise. She is under some situational stress with her husband under treatment for lung cancer. ____________________________ PAST HISTORY  Past Medical Illnesses:  hypertension, GERD, anxiety, irritable bowel syndrome, sleep apnea, obesity;  Cardiovascular Illnesses:  atrial fibrillation-paroxysmal;  Surgical Procedures:  appendectomy, hysterectomy, tonsillectomy, app;  Cardiology Procedures-Invasive:  no previous interventional or invasive cardiology procedures;  Cardiology Procedures-Noninvasive:  treadmill cardiolite April 2012, echocardiogram August 2013;  LVEF of 60% documented via echocardiogram on 08/10/2012,   ____________________________ CARDIO-PULMONARY TEST DATES EKG Date:  07/28/2013;  Nuclear Study Date:  03/27/2011;  Echocardiography Date: 08/10/2012;  Chest Xray Date: 07/02/2013;   ____________________________ FAMILY HISTORY Mother -- Malignant neoplasm of lung, Deceased ____________________________ SOCIAL HISTORY Alcohol Use:  does not use alcohol;  Smoking:  used to smoke but quit, 25 pack year history;  Diet:  regular diet;  Lifestyle:  married;  Exercise:  no regular exercise;  Residence:  lives with husband;   ____________________________ REVIEW OF SYSTEMS General:  obesity  Integumentary:no rashes or new skin lesions. Eyes: wears eye glasses/contact lenses Ears, Nose,  Throat, Mouth:  denies any hearing loss, epistaxis, hoarseness or difficulty speaking. Respiratory: mild dyspnea with exertion Cardiovascular:  please review HPI Abdominal: see HPIGenitourinary-Female: no dysuria, urgency, frequency, UTIs, or stress incontinence Musculoskeletal:  generalized arthritis Neurological:  ocular migraines Psychiatric:  situational stress  ____________________________ PHYSICAL EXAMINATION VITAL SIGNS  Blood Pressure:  174/92 Sitting, Right arm, regular cuff  , 164/90 Supine, Right arm, regular cuff  , 140/90 Standing, Left arm and regular cuff   Pulse:  64/min. Weight:  228.00 lbs. Height:  69"BMI: 33  Constitutional:  pleasant white female in no acute distress, moderately obese Skin:  warm and dry to touch, no apparent skin lesions, or masses noted. Head:  normocephalic, normal hair pattern, no masses or tenderness Eyes:  EOMS Intact, PERRLA, C and S clear, Funduscopic exam not done. ENT:  ears, nose and throat reveal no gross abnormalities.  Dentition good. Neck:  supple, without massess. No JVD, thyromegaly or carotid bruits. Carotid upstroke normal. Chest:  normal symmetry, clear to auscultation and percussion. Cardiac:  regular rhythm, normal S1 and S2, No S3 or S4, no murmurs, gallops or rubs detected. Abdomen:  abdomen soft,non-tender, no masses, no hepatospenomegaly, or aneurysm noted Peripheral Pulses:  the femoral,dorsalis pedis, and posterior tibial pulses are full and equal bilaterally with no bruits auscultated. Extremities & Back:  no deformities, clubbing, cyanosis, erythema or edema observed. Normal muscle strength and tone. Neurological:  no gross motor or sensory deficits noted, affect appropriate, oriented x3. ____________________________ IMPRESSIONS/PLAN  1. Chest discomfort with some atypical features. It is possible that she does have esophageal spasm. Previous cardiac workup for ischemia was over 2 years ago 2. Hypertension borderline control 3.  Obesity 4. Sleep apnea 5. History of ocular migraine  Recommendations:  It is conceivable that her chest pain could be due to esophageal spasm. At her age and with hypertension it would be important to be sure she does not have ischemia also.  My recommendations would be for her to have a nuclear perfusion scan to be sure she does not have a high-risk situation for ischemia. I think also that she could use nitroglycerin as this will work for coronary spasm also. It may be worthwhile increasing her dose of verapamil or possibly changing her to amlodipine depending on its effects on headaches. Thank you for asking me to see this nice woman with you.  Her EKG is normal today. ____________________________ TODAYS ORDERS  1. Treadmill 1 day Cardiolite: At Patient Convenience  2. 12 Lead EKG: Today                       ____________________________ Cardiology Physician:  Darden Palmer MD [facility]

## 2013-10-01 IMAGING — CR DG CHEST 1V PORT
1 series · 1 of 1 positions shown · non-contrast
Comparison: 03/12/2011

CLINICAL DATA: Left-sided chest pain for 2 weeks.  Vomiting today.

PORTABLE CHEST - 1 VIEW

[view not recorded]
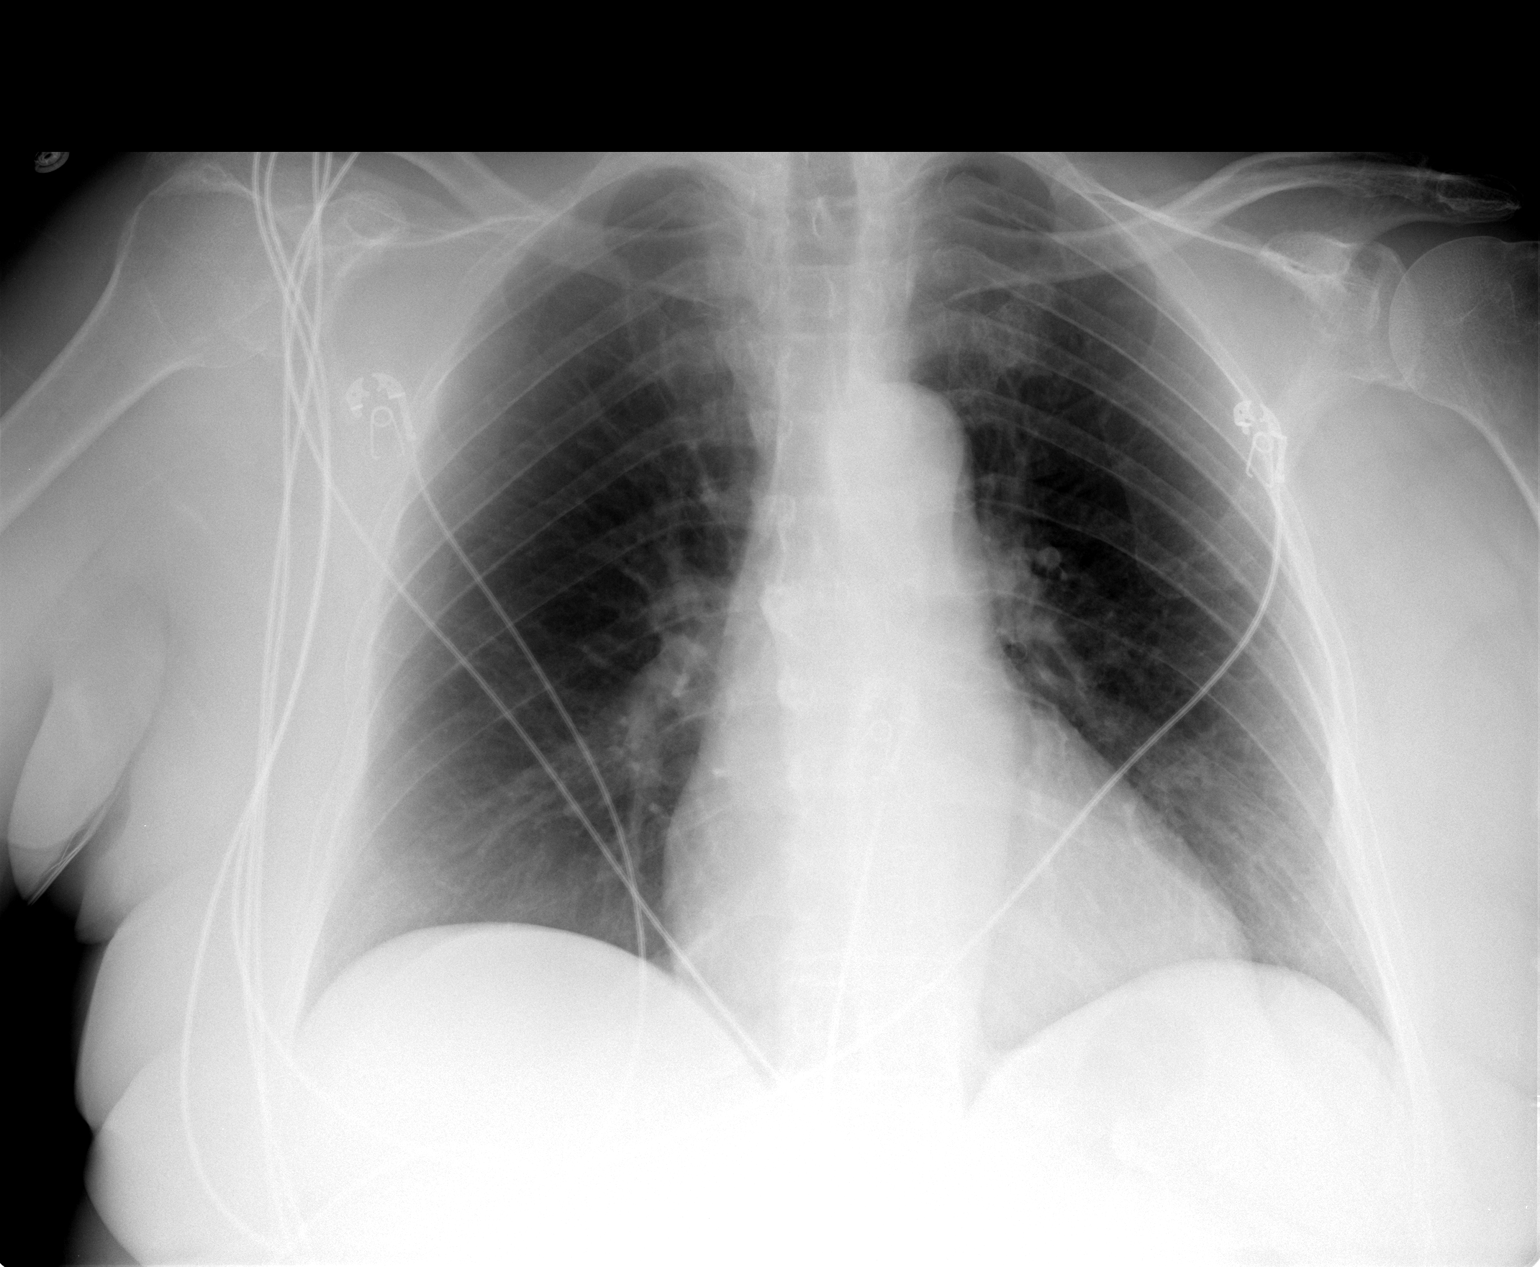

[1 of 1 positions shown; findings below may reference images not displayed]

FINDINGS: Slightly shallow inspiration. The heart size and
pulmonary vascularity are normal. The lungs appear clear and
expanded without focal air space disease or consolidation. No
blunting of the costophrenic angles.  No pneumothorax.  Mediastinal
contours appear intact.  No significant change since previous
study.
IMPRESSION: No evidence of active pulmonary disease.

## 2013-10-28 ENCOUNTER — Other Ambulatory Visit: Payer: Self-pay | Admitting: Dermatology

## 2013-12-24 ENCOUNTER — Other Ambulatory Visit: Payer: Self-pay

## 2013-12-24 DIAGNOSIS — Z1231 Encounter for screening mammogram for malignant neoplasm of breast: Secondary | ICD-10-CM

## 2013-12-30 ENCOUNTER — Ambulatory Visit
Admission: RE | Admit: 2013-12-30 | Discharge: 2013-12-30 | Disposition: A | Discharge status: Home / Self Care | Payer: Medicare Other | Source: Ambulatory Visit

## 2013-12-30 DIAGNOSIS — Z1231 Encounter for screening mammogram for malignant neoplasm of breast: Secondary | ICD-10-CM

## 2014-05-31 ENCOUNTER — Other Ambulatory Visit: Payer: Medicare Other | Admitting: Adult Health

## 2014-06-14 ENCOUNTER — Ambulatory Visit (INDEPENDENT_AMBULATORY_CARE_PROVIDER_SITE_OTHER): Payer: Medicare Other | Admitting: Adult Health

## 2014-06-14 ENCOUNTER — Encounter: Payer: Self-pay | Admitting: Adult Health

## 2014-06-14 VITALS — BP 152/82 | HR 76 | Ht 69.0 in | Wt 236.0 lb

## 2014-06-14 DIAGNOSIS — Z01419 Encounter for gynecological examination (general) (routine) without abnormal findings: Secondary | ICD-10-CM

## 2014-06-14 DIAGNOSIS — Z1212 Encounter for screening for malignant neoplasm of rectum: Secondary | ICD-10-CM

## 2014-06-14 LAB — HEMOCCULT GUIAC POC 1CARD (OFFICE): FECAL OCCULT BLD: NEGATIVE

## 2014-06-14 NOTE — Progress Notes (Signed)
Patient ID: Robyn Pena, female   DOB: 1941-03-20, 73 y.o.   MRN: [id number] History of Present Illness: Robyn Pena is a 73 year old white female, recently widowed, in for a physical.Wants a better diet.   Current Medications, Allergies, Past Medical History, Past Surgical History, Family History and Social History were reviewed in [employer] record.     Review of Systems: Patient denies any headaches, blurred vision, shortness of breath, chest pain,or abdominal pain.She has IBS and alternates constipation and diarrhea. She has swelling in 4th toe right foot where she kicked table about 3 weeks ago, no bruising, call orthopedic in [place], she lost her husband the end of March to lung cancer and she is still has grief.She has noticed she gets up more at night to void and may have urge incontinence.She had labs with Dr Willey Blade.Not sleeping as well.    Physical Exam:BP 152/82  Pulse 76  Ht 5\' 9"  (1.753 m)  Wt 236 lb (107.049 kg)  BMI 34.84 kg/m2 General:  Well developed, well nourished, no acute distress Skin:  Warm and dry Neck:  Midline trachea, normal thyroid, no carotid bruits heard Lungs; Clear to auscultation bilaterally Breast:  No dominant palpable mass, retraction, or nipple discharge Cardiovascular: Regular rate and rhythm Abdomen:  Soft, non tender, no hepatosplenomegaly Pelvic:  External genitalia is normal in appearance for age.  The vagina has loss of color,moisture and rugae.The cervix and uterus are absent. No  adnexal masses or tenderness noted. Rectal: Good sphincter tone, no polyps, or hemorrhoids felt.  Hemoccult negative. Extremities: has swelling 4 th toe right foot, no other swelling noted Psych:  Alert and cooperative, seems happy, she has 2 cats   Impression: Yearly gyn exam no pap    Plan: Physical in 2 year Mammogram yearly  Colonoscopy per GI Labs with PCP Take time to grieve, go to class at [facility] for Grief, go out with  friends Try Whole 30 diet and eaffeine at night, and it is OK to take ativan at [facility] Call prn to talk

## 2014-06-14 NOTE — Patient Instructions (Signed)
Physical in  2 years Mammogram yearly  colonoscopy per GI Decrease caffeine at night WHOLE 30

## 2014-08-24 IMAGING — CR DG CHEST 1V PORT
1 series · 1 of 1 positions shown · non-contrast
Comparison: 04/05/2013

CLINICAL DATA: Chest pain, neck pain, and face pain.

PORTABLE CHEST - 1 VIEW

[portable]
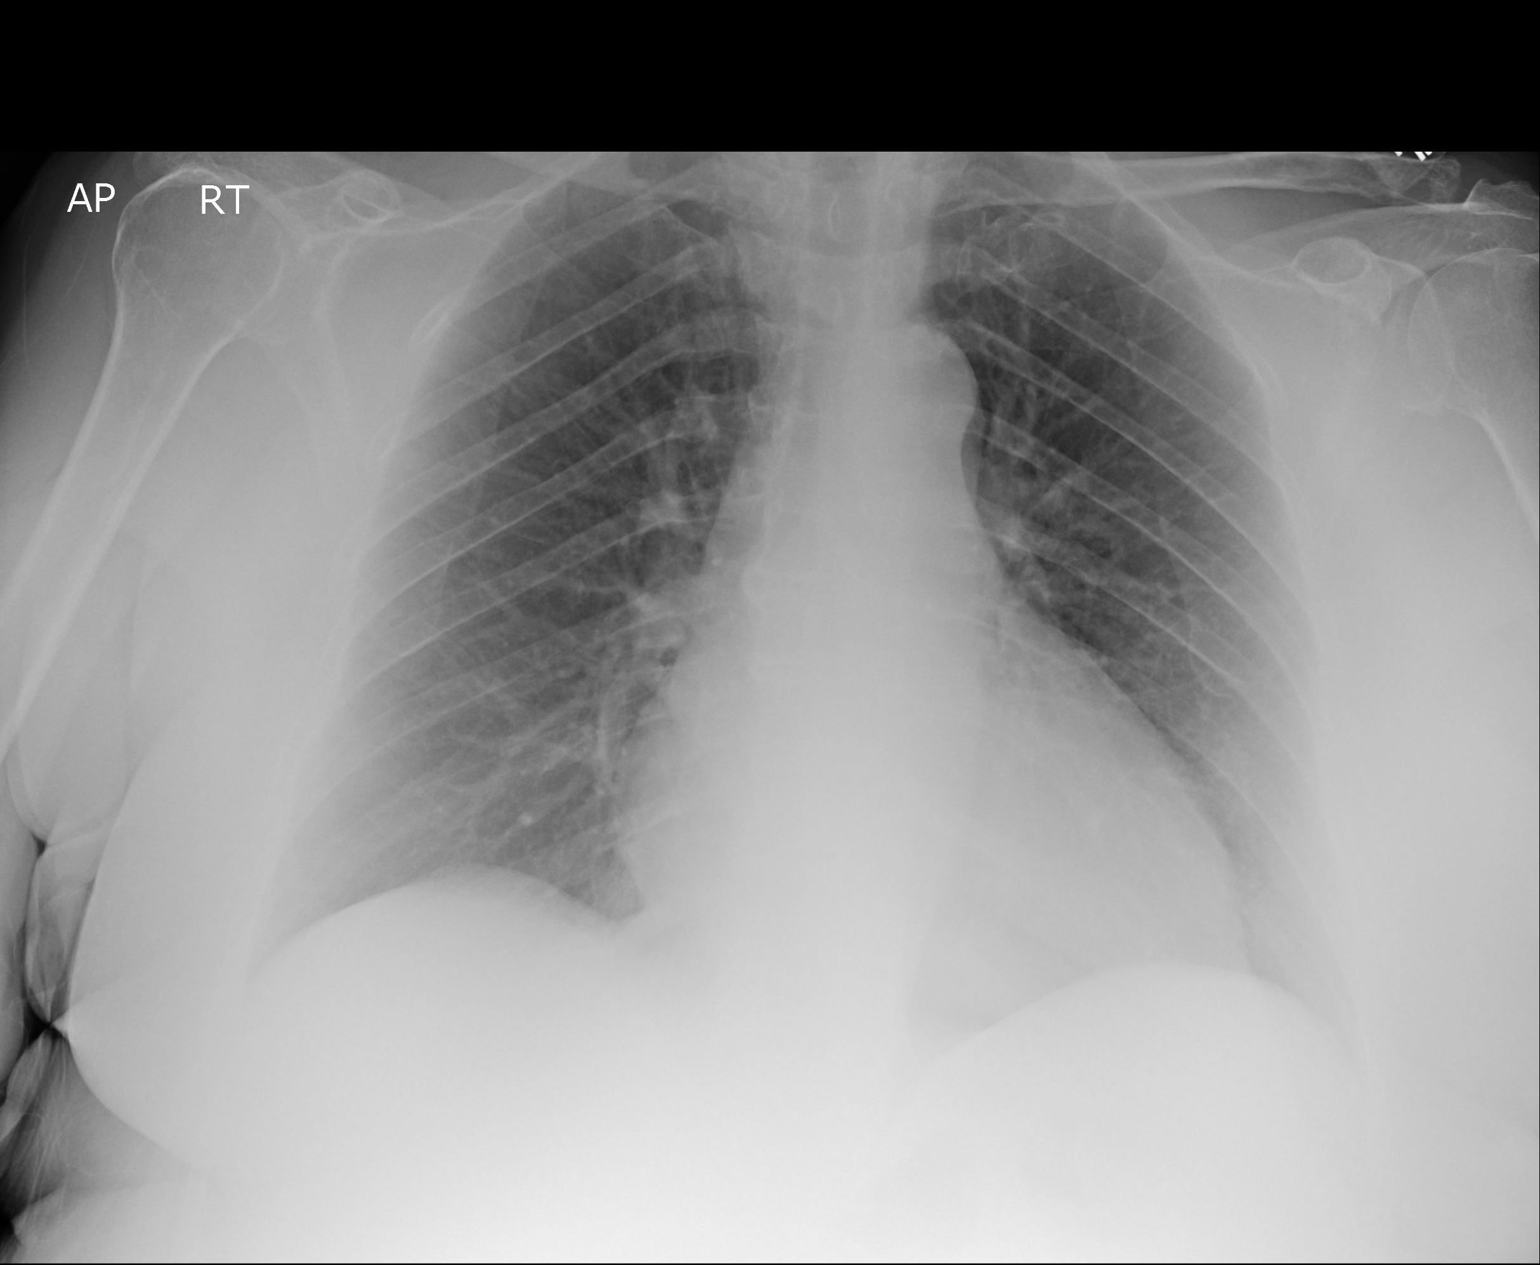

[1 of 1 positions shown; findings below may reference images not displayed]

FINDINGS: Borderline heart size is probably normal for AP
technique.  No vascular congestion.  No focal airspace disease in
the lungs.  No blunting of costophrenic angles.  No pneumothorax.
Mediastinal contours appear intact.  Degenerative changes in the
spine.  No significant change since previous study.
IMPRESSION: No evidence of active pulmonary disease.

## 2014-09-07 ENCOUNTER — Encounter: Payer: Self-pay | Admitting: Adult Health

## 2014-09-07 ENCOUNTER — Ambulatory Visit (INDEPENDENT_AMBULATORY_CARE_PROVIDER_SITE_OTHER): Payer: Medicare Other | Admitting: Adult Health

## 2014-09-07 VITALS — BP 130/80 | Ht 69.0 in | Wt 238.0 lb

## 2014-09-07 DIAGNOSIS — R928 Other abnormal and inconclusive findings on diagnostic imaging of breast: Secondary | ICD-10-CM

## 2014-09-07 DIAGNOSIS — N644 Mastodynia: Secondary | ICD-10-CM

## 2014-09-07 DIAGNOSIS — N63 Unspecified lump in unspecified breast: Secondary | ICD-10-CM

## 2014-09-07 HISTORY — DX: Unspecified lump in unspecified breast: N63.0

## 2014-09-07 HISTORY — DX: Mastodynia: N64.4

## 2014-09-07 NOTE — Progress Notes (Signed)
Subjective:     Patient ID: Robyn Pena, female   DOB: 04-01-1941, 73 y.o.   MRN: [id number]  HPI Robyn Pena is a 73 year old white female in complaining of breast tenderness for 2 weeks,has new bra and has increased caffeine on vacation.  Review of Systems See HPI Reviewed past medical,surgical, social and family history. Reviewed medications and allergies.     Objective:   Physical Exam BP 130/80  Ht 5\' 9"  (1.753 m)  Wt 238 lb (107.956 kg)  BMI 35.13 kg/m2    Skin warm and dry,  Breasts:no dominate palpable mass, retraction or nipple discharge on right, on left no retraction or nipple discharge, has irregularities and nodule at 12 o'clock at [place],?cyst about 2 cms.   Assessment:     Left breast nodule Bilateral breast tenderness    Plan:     Diagnostic bilateral mammogram and left breast US 9/22 at [facility] Follow up prn  Decrease caffeine,do not wear new bra for now and review handout on breast cyst

## 2014-09-07 NOTE — Patient Instructions (Signed)
Breast Cyst A breast cyst is a sac in the breast that is filled with fluid. Breast cysts are common in women. Women can have one or many cysts. When the breasts contain many cysts, it is usually due to a noncancerous (benign) condition called fibrocystic change. These lumps form under the influence of female hormones (estrogen and progesterone). The lumps are most often located in the upper, outer portion of the breast. They are often more swollen, painful, and tender before your period starts. They usually disappear after menopause, unless you are on hormone therapy.  There are several types of cysts:  Macrocyst. This is a cyst that is about 2 in. (5.1 cm) in diameter.   Microcyst. This is a tiny cyst that you cannot feel but can be seen with a mammogram or an ultrasound.   Galactocele. This is a cyst containing milk that may develop if you suddenly stop breastfeeding.   Sebaceous cyst of the skin. This type of cyst is not in the breast tissue itself. Breast cysts do not increase your risk of breast cancer. However, they must be monitored closely because they can be cancerous.  CAUSES  It is not known exactly what causes a breast cyst to form. Possible causes include:  An overgrowth of milk glands and connective tissue in the breast can block the milk glands, causing them to fill with fluid.   Scar tissue in the breast from previous surgery may block the glands, causing a cyst.  RISK FACTORS Estrogen may influence the development of a breast cyst.  SIGNS AND SYMPTOMS   Feeling a smooth, round, soft lump (like a grape) in the breast that is easily moveable.   Breast discomfort or pain.  Increase in size of the lump before your menstrual period and decrease in its size after your menstrual period.  DIAGNOSIS  A cyst can be felt during a physical exam by your health care provider. A breast X-ray exam (mammogram) and ultrasonography will be done to confirm the diagnosis. Fluid may  be removed from the cyst with a needle (fine needle aspiration) to make sure the cyst is not cancerous.  TREATMENT  Treatment may not be necessary. Your health care provider may monitor the cyst to see if it goes away on its own. If treatment is needed, it may include:  Hormone treatment.   Needle aspiration. There is a chance of the cyst coming back after aspiration.   Surgery to remove the whole cyst.  HOME CARE INSTRUCTIONS   Keep all follow-up appointments with your health care provider.  See your health care provider regularly:  Get a yearly exam by your health care provider.  Have a clinical breast exam by a health care provider every 1-3 years if you are 20-40 years of age. After age 40 years, you should have the exam every year.   Get mammogram tests as directed by your health care provider.   Understand the normal appearance and feel of your breasts and perform breast self-exams.   Only take over-the-counter or prescription medicines as directed by your health care provider.   Wear a supportive bra, especially when exercising.   Avoid caffeine.   Reduce your salt intake, especially before your menstrual period. Too much salt can cause fluid retention, breast swelling, and discomfort.  SEEK MEDICAL CARE IF:   You feel, or think you feel, a lump in your breast.   You notice that both breasts look or feel different than usual.   Your   breast is still causing pain after your menstrual period is over.   You need medicine for breast pain and swelling that occurs with your menstrual period.  SEEK IMMEDIATE MEDICAL CARE IF:   You have severe pain, tenderness, redness, or warmth in your breast.   You have nipple discharge or bleeding.   Your breast lump becomes hard and painful.   You find new lumps or bumps that were not there before.   You feel lumps in your armpit (axilla).   You notice dimpling or wrinkling of the breast or nipple.   You  have a fever.  MAKE SURE YOU:  Understand these instructions.  Will watch your condition.  Will get help right away if you are not doing well or get worse. Document Released: 12/09/2005 Document Revised: 08/11/2013 Document Reviewed: 07/08/2013 [facility] Patient Information 2015 [place], [place]. This information is not intended to replace advice given to you by your health care provider. Make sure you discuss any questions you have with your health care provider. Mammogram 9/22 at 2 pm

## 2014-09-13 ENCOUNTER — Other Ambulatory Visit: Payer: Medicare Other

## 2014-09-13 ENCOUNTER — Ambulatory Visit
Admission: RE | Admit: 2014-09-13 | Discharge: 2014-09-13 | Disposition: A | Discharge status: Home / Self Care | Payer: Medicare Other | Source: Ambulatory Visit | Attending: Adult Health | Admitting: Adult Health

## 2014-09-13 DIAGNOSIS — N63 Unspecified lump in unspecified breast: Secondary | ICD-10-CM

## 2014-09-13 DIAGNOSIS — N644 Mastodynia: Secondary | ICD-10-CM

## 2014-09-22 ENCOUNTER — Encounter: Payer: Self-pay | Admitting: Internal Medicine

## 2014-10-24 ENCOUNTER — Encounter: Payer: Self-pay | Admitting: Adult Health

## 2015-02-07 ENCOUNTER — Other Ambulatory Visit (HOSPITAL_COMMUNITY): Payer: Self-pay | Admitting: Internal Medicine

## 2015-02-07 ENCOUNTER — Other Ambulatory Visit: Payer: Self-pay | Admitting: Adult Health

## 2015-02-07 DIAGNOSIS — Z09 Encounter for follow-up examination after completed treatment for conditions other than malignant neoplasm: Secondary | ICD-10-CM

## 2015-03-10 ENCOUNTER — Other Ambulatory Visit: Payer: Self-pay | Admitting: Adult Health

## 2015-03-10 DIAGNOSIS — Z09 Encounter for follow-up examination after completed treatment for conditions other than malignant neoplasm: Secondary | ICD-10-CM

## 2015-03-14 ENCOUNTER — Ambulatory Visit (HOSPITAL_COMMUNITY)
Admission: RE | Admit: 2015-03-14 | Discharge: 2015-03-14 | Disposition: A | Discharge status: Home / Self Care | Payer: Medicare Other | Source: Ambulatory Visit | Attending: Internal Medicine | Admitting: Internal Medicine

## 2015-03-14 DIAGNOSIS — Z09 Encounter for follow-up examination after completed treatment for conditions other than malignant neoplasm: Secondary | ICD-10-CM

## 2015-03-14 DIAGNOSIS — N63 Unspecified lump in breast: Secondary | ICD-10-CM | POA: Insufficient documentation

## 2015-03-16 ENCOUNTER — Other Ambulatory Visit (HOSPITAL_COMMUNITY): Payer: Self-pay | Admitting: Internal Medicine

## 2015-03-16 DIAGNOSIS — M810 Age-related osteoporosis without current pathological fracture: Secondary | ICD-10-CM

## 2015-03-28 ENCOUNTER — Other Ambulatory Visit (HOSPITAL_COMMUNITY): Payer: Medicare Other

## 2015-03-29 ENCOUNTER — Ambulatory Visit (HOSPITAL_COMMUNITY)
Admission: RE | Admit: 2015-03-29 | Discharge: 2015-03-29 | Disposition: A | Discharge status: Home / Self Care | Payer: Medicare Other | Source: Ambulatory Visit | Attending: Internal Medicine | Admitting: Internal Medicine

## 2015-03-29 DIAGNOSIS — M810 Age-related osteoporosis without current pathological fracture: Secondary | ICD-10-CM | POA: Insufficient documentation

## 2015-06-21 ENCOUNTER — Other Ambulatory Visit (HOSPITAL_COMMUNITY): Payer: Self-pay | Admitting: Pulmonary Disease

## 2015-06-21 ENCOUNTER — Ambulatory Visit (HOSPITAL_COMMUNITY)
Admission: RE | Admit: 2015-06-21 | Discharge: 2015-06-21 | Disposition: A | Discharge status: Home / Self Care | Payer: Medicare Other | Source: Ambulatory Visit | Attending: Pulmonary Disease | Admitting: Pulmonary Disease

## 2015-06-21 DIAGNOSIS — R0602 Shortness of breath: Secondary | ICD-10-CM

## 2015-06-21 DIAGNOSIS — I4891 Unspecified atrial fibrillation: Secondary | ICD-10-CM | POA: Insufficient documentation

## 2015-06-21 DIAGNOSIS — Z87891 Personal history of nicotine dependence: Secondary | ICD-10-CM | POA: Diagnosis not present

## 2015-06-21 DIAGNOSIS — K219 Gastro-esophageal reflux disease without esophagitis: Secondary | ICD-10-CM | POA: Diagnosis not present

## 2015-08-04 ENCOUNTER — Other Ambulatory Visit (HOSPITAL_COMMUNITY): Payer: Self-pay | Admitting: Respiratory Therapy

## 2015-08-04 DIAGNOSIS — G4730 Sleep apnea, unspecified: Secondary | ICD-10-CM

## 2015-08-07 ENCOUNTER — Other Ambulatory Visit (HOSPITAL_COMMUNITY): Payer: Self-pay | Admitting: Respiratory Therapy

## 2015-08-08 ENCOUNTER — Institutional Professional Consult (permissible substitution): Payer: Medicare Other | Admitting: Internal Medicine

## 2015-08-16 ENCOUNTER — Other Ambulatory Visit (HOSPITAL_COMMUNITY): Payer: Self-pay | Admitting: Respiratory Therapy

## 2015-08-17 ENCOUNTER — Institutional Professional Consult (permissible substitution): Payer: Medicare Other | Admitting: Internal Medicine

## 2015-08-18 ENCOUNTER — Other Ambulatory Visit (HOSPITAL_COMMUNITY): Payer: Self-pay | Admitting: Internal Medicine

## 2015-08-18 DIAGNOSIS — N6320 Unspecified lump in the left breast, unspecified quadrant: Secondary | ICD-10-CM

## 2015-08-18 DIAGNOSIS — N6002 Solitary cyst of left breast: Secondary | ICD-10-CM

## 2015-08-18 DIAGNOSIS — Z09 Encounter for follow-up examination after completed treatment for conditions other than malignant neoplasm: Secondary | ICD-10-CM

## 2015-09-14 ENCOUNTER — Encounter: Payer: Self-pay | Admitting: Adult Health

## 2015-09-14 ENCOUNTER — Telehealth: Payer: Self-pay | Admitting: *Deleted

## 2015-09-14 ENCOUNTER — Ambulatory Visit (INDEPENDENT_AMBULATORY_CARE_PROVIDER_SITE_OTHER): Payer: Medicare Other | Admitting: Adult Health

## 2015-09-14 VITALS — BP 160/80 | HR 80 | Ht 69.0 in | Wt 243.0 lb

## 2015-09-14 DIAGNOSIS — R350 Frequency of micturition: Secondary | ICD-10-CM | POA: Diagnosis not present

## 2015-09-14 DIAGNOSIS — R300 Dysuria: Secondary | ICD-10-CM | POA: Diagnosis not present

## 2015-09-14 DIAGNOSIS — R319 Hematuria, unspecified: Secondary | ICD-10-CM

## 2015-09-14 HISTORY — DX: Hematuria, unspecified: R31.9

## 2015-09-14 HISTORY — DX: Frequency of micturition: R35.0

## 2015-09-14 LAB — POCT URINALYSIS DIPSTICK
Glucose, UA: NEGATIVE
KETONES UA: NEGATIVE
Nitrite, UA: NEGATIVE
PROTEIN UA: NEGATIVE

## 2015-09-14 MED ORDER — SULFAMETHOXAZOLE-TRIMETHOPRIM 800-160 MG PO TABS: 1.0000 | ORAL_TABLET | Freq: Two times a day (BID) | ORAL | Status: DC

## 2015-09-14 NOTE — Telephone Encounter (Signed)
Thinks has UTI to come in today at 3:15

## 2015-09-14 NOTE — Patient Instructions (Addendum)
Push water Take septra ds Use AZO Urinary Tract Infection Urinary tract infections (UTIs) can develop anywhere along your urinary tract. Your urinary tract is your body's drainage system for removing wastes and extra water. Your urinary tract includes two kidneys, two ureters, a bladder, and a urethra. Your kidneys are a pair of bean-shaped organs. Each kidney is about the size of your fist. They are located below your ribs, one on each side of your spine. CAUSES Infections are caused by microbes, which are microscopic organisms, including fungi, viruses, and bacteria. These organisms are so small that they can only be seen through a microscope. Bacteria are the microbes that most commonly cause UTIs. SYMPTOMS  Symptoms of UTIs may vary by age and gender of the patient and by the location of the infection. Symptoms in young women typically include a frequent and intense urge to urinate and a painful, burning feeling in the bladder or urethra during urination. Older women and men are more likely to be tired, shaky, and weak and have muscle aches and abdominal pain. A fever may mean the infection is in your kidneys. Other symptoms of a kidney infection include pain in your back or sides below the ribs, nausea, and vomiting. DIAGNOSIS To diagnose a UTI, your caregiver will ask you about your symptoms. Your caregiver also will ask to provide a urine sample. The urine sample will be tested for bacteria and white blood cells. White blood cells are made by your body to help fight infection. TREATMENT  Typically, UTIs can be treated with medication. Because most UTIs are caused by a bacterial infection, they usually can be treated with the use of antibiotics. The choice of antibiotic and length of treatment depend on your symptoms and the type of bacteria causing your infection. HOME CARE INSTRUCTIONS  If you were prescribed antibiotics, take them exactly as your caregiver instructs you. Finish the medication  even if you feel better after you have only taken some of the medication.  Drink enough water and fluids to keep your urine clear or pale yellow.  Avoid caffeine, tea, and carbonated beverages. They tend to irritate your bladder.  Empty your bladder often. Avoid holding urine for long periods of time.  Empty your bladder before and after sexual intercourse.  After a bowel movement, women should cleanse from front to back. Use each tissue only once. SEEK MEDICAL CARE IF:   You have back pain.  You develop a fever.  Your symptoms do not begin to resolve within 3 days. SEEK IMMEDIATE MEDICAL CARE IF:   You have severe back pain or lower abdominal pain.  You develop chills.  You have nausea or vomiting.  You have continued burning or discomfort with urination. MAKE SURE YOU:   Understand these instructions.  Will watch your condition.  Will get help right away if you are not doing well or get worse. Document Released: 09/18/2005 Document Revised: 06/09/2012 Document Reviewed: 01/17/2012 [facility] Patient Information 2015 [place], [place]. This information is not intended to replace advice given to you by your health care provider. Make sure you discuss any questions you have with your health care provider. WHOLE 30

## 2015-09-14 NOTE — Progress Notes (Signed)
Subjective:     Patient ID: Robyn Pena, female   DOB: October 29, 1941, 74 y.o.   MRN: [id number]  HPI Robyn Pena is a 74 year old white female, widowed in complaining of urinary frequency and urgency and burning with urination, was worse last  Week, has used AZO.She is getting sleep study soon.  Review of Systems +urinary frequency and urgency and burning with urination, all other systems negative Reviewed past medical,surgical, social and family history. Reviewed medications and allergies.     Objective:   Physical Exam BP 160/80 mmHg  Pulse 80  Ht 5\' 9"  (1.753 m)  Wt 243 lb (110.224 kg)  BMI 35.87 kg/m2urine trace blood and trace leuks, Skin warm and dry, bladder non tender, No CVAT    Assessment:     Urinary frequency  Burning with urination Hematuria     Plan:     Rx septra ds 1 bid x 7 days Ok to take AZO Push fluids UA C&S sent Review handout on UTI

## 2015-09-15 LAB — MICROSCOPIC EXAMINATION

## 2015-09-15 LAB — URINALYSIS, ROUTINE W REFLEX MICROSCOPIC
BILIRUBIN UA: NEGATIVE
GLUCOSE, UA: NEGATIVE
KETONES UA: NEGATIVE
NITRITE UA: NEGATIVE
Protein, UA: NEGATIVE
RBC UA: NEGATIVE
SPEC GRAV UA: 1.022 (ref 1.005–1.030)
UUROB: 0.2 mg/dL (ref 0.2–1.0)
pH, UA: 5.5 (ref 5.0–7.5)

## 2015-09-16 LAB — URINE CULTURE

## 2015-09-17 ENCOUNTER — Ambulatory Visit: Payer: Medicare Other | Attending: Pulmonary Disease | Admitting: Sleep Medicine

## 2015-09-17 DIAGNOSIS — G4730 Sleep apnea, unspecified: Secondary | ICD-10-CM

## 2015-09-17 DIAGNOSIS — G4733 Obstructive sleep apnea (adult) (pediatric): Secondary | ICD-10-CM | POA: Insufficient documentation

## 2015-09-17 DIAGNOSIS — G4761 Periodic limb movement disorder: Secondary | ICD-10-CM | POA: Insufficient documentation

## 2015-09-17 DIAGNOSIS — R0683 Snoring: Secondary | ICD-10-CM | POA: Diagnosis present

## 2015-09-20 ENCOUNTER — Institutional Professional Consult (permissible substitution): Payer: Medicare Other | Admitting: Pulmonary Disease

## 2015-09-22 ENCOUNTER — Other Ambulatory Visit (HOSPITAL_COMMUNITY): Payer: Self-pay | Admitting: Respiratory Therapy

## 2015-09-23 NOTE — Sleep Study (Signed)
  [place] A. Merlene Laughter, MD     [email or web address]             NOCTURNAL POLYSOMNOGRAPHY   LOCATION: [facility]  Patient Name: Robyn Pena, Robyn Pena Date: 09/17/2015 Gender: Female D.O.B: 1941-04-27 Age (years): 64 Referring Provider: Not Available Height (inches): 69 Interpreting Physician: Phillips Odor MD, ABSM Weight (lbs): 243 RPSGT: Rosebud Poles BMI: 36 MRN: [id number] Neck Size: 16.00 CLINICAL INFORMATION Sleep Study Type: NPSG     Indication for sleep study: Snoring and fatigue.     Epworth Sleepiness Score: 7     SLEEP STUDY TECHNIQUE As per the AASM Manual for the Scoring of Sleep and Associated Events v2.3 (April 2016) with a hypopnea requiring 4% desaturations.  The channels recorded and monitored were frontal, central and occipital EEG, electrooculogram (EOG), submentalis EMG (chin), nasal and oral airflow, thoracic and abdominal wall motion, anterior tibialis EMG, snore microphone, electrocardiogram, and pulse oximetry.  MEDICATIONS Patient's medications include: N/A. Medications self-administered by patient during sleep study : No sleep medicine administered.  SLEEP ARCHITECTURE The study was initiated at 10:04:53 PM and ended at 4:08:28 AM.  Sleep onset time was 20.0 minutes and the sleep efficiency was 53.8%. The total sleep time was 195.5 minutes.  Stage REM latency was N/A minutes.  The patient spent 6.14% of the night in stage N1 sleep, 61.89% in stage N2 sleep, 31.97% in stage N3 and 0.00% in REM.  Alpha intrusion was absent.  Supine sleep was 0.00%.  RESPIRATORY PARAMETERS The overall apnea/hypopnea index (AHI) was 24.2 per hour. There were 2 total apneas, including 2 obstructive, 0 central and 0 mixed apneas. There were 77 hypopneas and 0 RERAs.  The AHI during Stage REM sleep was N/A per hour.  AHI while supine was N/A per hour.  The mean oxygen saturation was 84.44%. The minimum SpO2 during sleep was 74.00%.  The patient had prolonged periods of desaturation and hyperventilation not associated with obstructive events or apneic events.  Loud snoring was noted during this study.  CARDIAC DATA The 2 lead EKG demonstrated sinus rhythm. The mean heart rate was N/A beats per minute. Other EKG findings include: None. LEG MOVEMENT DATA The total PLMS were 47 with a resulting PLMS index of 14.42. Associated arousal with leg movement index was 0.0 .  IMPRESSIONS Moderate obstructive sleep apnea. A formal CPAP titration study is suggested. Hypoventilation syndrome. Mild periodic limb movements of sleep occurred during the study. No significant associated arousals.     Delano Metz, MD Diplomate, American Board of Sleep Medicine.

## 2015-09-26 ENCOUNTER — Ambulatory Visit (HOSPITAL_COMMUNITY)
Admission: RE | Admit: 2015-09-26 | Discharge: 2015-09-26 | Disposition: A | Discharge status: Home / Self Care | Payer: Medicare Other | Source: Ambulatory Visit | Attending: Internal Medicine | Admitting: Internal Medicine

## 2015-09-26 ENCOUNTER — Other Ambulatory Visit (HOSPITAL_COMMUNITY): Payer: Self-pay | Admitting: Internal Medicine

## 2015-09-26 DIAGNOSIS — N63 Unspecified lump in breast: Secondary | ICD-10-CM | POA: Insufficient documentation

## 2015-09-26 DIAGNOSIS — N6002 Solitary cyst of left breast: Secondary | ICD-10-CM

## 2015-09-26 DIAGNOSIS — Z09 Encounter for follow-up examination after completed treatment for conditions other than malignant neoplasm: Secondary | ICD-10-CM

## 2015-09-27 ENCOUNTER — Other Ambulatory Visit (HOSPITAL_COMMUNITY): Payer: Self-pay | Admitting: Respiratory Therapy

## 2016-04-19 ENCOUNTER — Ambulatory Visit: Payer: Medicare Other | Admitting: Nutrition

## 2016-05-02 ENCOUNTER — Encounter: Payer: Self-pay | Admitting: Nutrition

## 2016-05-02 ENCOUNTER — Encounter: Payer: Medicare Other | Attending: Internal Medicine | Admitting: Nutrition

## 2016-05-02 VITALS — Ht 69.0 in | Wt 250.2 lb

## 2016-05-02 DIAGNOSIS — I10 Essential (primary) hypertension: Secondary | ICD-10-CM | POA: Insufficient documentation

## 2016-05-02 DIAGNOSIS — E119 Type 2 diabetes mellitus without complications: Secondary | ICD-10-CM | POA: Insufficient documentation

## 2016-05-02 DIAGNOSIS — E118 Type 2 diabetes mellitus with unspecified complications: Secondary | ICD-10-CM

## 2016-05-02 DIAGNOSIS — E669 Obesity, unspecified: Secondary | ICD-10-CM | POA: Insufficient documentation

## 2016-05-02 DIAGNOSIS — IMO0002 Reserved for concepts with insufficient information to code with codable children: Secondary | ICD-10-CM

## 2016-05-02 DIAGNOSIS — E1165 Type 2 diabetes mellitus with hyperglycemia: Secondary | ICD-10-CM

## 2016-05-02 NOTE — Progress Notes (Signed)
Diabetes Self-Management Education  Visit Type: First/Initial  Appt. Start Time: 1330 Appt. End Time: 1430  05/02/2016  [name], identified by name and date of birth, is a 75 y.o. female with a diagnosis of Diabetes: Type 2. She lost her husband 2 yrs ago and has been eating a lot covenience and fast foods and eating out with friends. Not exercising much due to arthritis. No family history of DM. Was on some steroids a few months ago. Wants to lose a few pounds and eat healthier and feel better. Not on any medications. Wants to control it with diet and exercise and weight loss..   Wt Readings from Last 3 Encounters:  05/02/16 250 lb 3.2 oz (113.49 kg)  09/17/15 243 lb (110.224 kg)  09/14/15 243 lb (110.224 kg)   Ht Readings from Last 3 Encounters:  05/02/16 5\' 9"  (1.753 m)  09/17/15 5\' 9"  (1.753 m)  09/14/15 5\' 9"  (1.753 m)   Body mass index is 36.93 kg/(m^2). @BMIFA @ Facility age limit for growth percentiles is 20 years. Facility age limit for growth percentiles is 20 years.  ASSESSMENT  Height 5\' 9"  (1.753 m), weight 250 lb 3.2 oz (113.49 kg). Body mass index is 36.93 kg/(m^2).      Diabetes Self-Management Education - 05/02/16 1451    Visit Information   Visit Type First/Initial   Initial Visit   Diabetes Type Type 1   Are you taking your medications as prescribed? Yes   Health Coping   How would you rate your overall health? Good   Psychosocial Assessment   Patient Belief/Attitude about Diabetes Motivated to manage diabetes   Self-care barriers None   Self-management support Friends;Family;Doctor's office   Other persons present Patient   Patient Concerns Nutrition/Meal planning;Healthy Lifestyle;Weight Control   Special Needs None   Preferred Learning Style Visual;Auditory   Learning Readiness Ready   How often do you need to have someone help you when you read instructions, pamphlets, or other written materials from your doctor or pharmacy? 1 - Never   What is the last grade level you completed in school? 12   Pre-Education Assessment   Patient understands the diabetes disease and treatment process. Needs Instruction   Patient understands incorporating nutritional management into lifestyle. Needs Instruction   Patient undertands incorporating physical activity into lifestyle. Needs Instruction   Patient understands using medications safely. Needs Instruction   Patient understands monitoring blood glucose, interpreting and using results Needs Instruction   Patient understands prevention, detection, and treatment of acute complications. Needs Instruction   Patient understands prevention, detection, and treatment of chronic complications. Needs Instruction   Patient understands how to develop strategies to address psychosocial issues. Needs Instruction   Complications   Last HgB A1C per patient/outside source 73 %   How often do you check your blood sugar? 0 times/day (not testing)   Dietary Intake   Breakfast PB on toast or bagel   Snack (morning) fruit   Lunch eats out at fast food   Dinner eats out [employer] (evening) misc   Beverage(s) diet sodas, sweet tea   Exercise   Exercise Type ADL's   How many days per week to you exercise? 0   Patient Education   Previous Diabetes Education No   Disease state  Definition of diabetes, type 1 and 2, and the diagnosis of diabetes;Factors that contribute to the development of diabetes;Explored patient's options for treatment of their diabetes   Nutrition management  Role of  diet in the treatment of diabetes and the relationship between the three main macronutrients and blood glucose level;Food label reading, portion sizes and measuring food.;Carbohydrate counting;Reviewed blood glucose goals for pre and post meals and how to evaluate the patients' food intake on their blood glucose level.;Meal timing in regards to the patients' current diabetes medication.   Physical activity and exercise   Role of exercise on diabetes management, blood pressure control and cardiac health.;Identified with patient nutritional and/or medication changes necessary with exercise.   Chronic complications Relationship between chronic complications and blood glucose control;Assessed and discussed foot care and prevention of foot problems   Psychosocial adjustment Role of stress on diabetes;Identified and addressed patients feelings and concerns about diabetes   Personal strategies to promote health Lifestyle issues that need to be addressed for better diabetes care;Helped patient develop diabetes management plan for (enter comment)   Individualized Goals (developed by patient)   Nutrition Follow meal plan discussed;General guidelines for healthy choices and portions discussed   Physical Activity Exercise 3-5 times per week   Medications Not Applicable   Monitoring  Not Applicable   Reducing Risk do foot checks daily   Post-Education Assessment   Patient understands the diabetes disease and treatment process. Demonstrates understanding / competency   Patient understands incorporating nutritional management into lifestyle. Needs Review   Patient undertands incorporating physical activity into lifestyle. Demonstrates understanding / competency   Patient understands prevention, detection, and treatment of acute complications. Demonstrates understanding / competency   Patient understands prevention, detection, and treatment of chronic complications. Demonstrates understanding / competency   Patient understands how to develop strategies to address psychosocial issues. Demonstrates understanding / competency   Patient understands how to develop strategies to promote health/change behavior. Demonstrates understanding / competency   Outcomes   Expected Outcomes Demonstrated interest in learning. Expect positive outcomes   Future DMSE 4-6 wks   Program Status Completed      Individualized Plan for Diabetes  Self-Management Training:   Learning Objective:  Patient will have a greater understanding of diabetes self-management. Patient education plan is to attend individual and/or group sessions per assessed needs and concerns.   Plan:   Patient Instructions  Goals 1 Follow Plate Method 2. Cut out diet sodas and sweet tea 3. Increase fresh fruits and vegetables. 4. Exercise 15-30 mins three times per week 5. Check about the [employer] for water aerobics. 6. Lose 2-3 lbs per month 7. Get A1C less than 7%.    Expected Outcomes:  Demonstrated interest in learning. Expect positive outcomes  Education material provided: Living Well with Diabetes, Food label handouts, A1C conversion sheet, Meal plan card and My Plate  If problems or questions, patient to contact team via:  Phone and Email  Future DSME appointment: 4-6 wks

## 2016-05-02 NOTE — Patient Instructions (Signed)
Goals 1 Follow Plate Method 2. Cut out diet sodas and sweet tea 3. Increase fresh fruits and vegetables. 4. Exercise 15-30 mins three times per week 5. Check about the [employer] for water aerobics. 6. Lose 2-3 lbs per month 7. Get A1C less than 7%.

## 2016-06-05 ENCOUNTER — Encounter: Payer: Medicare Other | Attending: Internal Medicine | Admitting: Nutrition

## 2016-06-05 ENCOUNTER — Encounter: Payer: Self-pay | Admitting: Nutrition

## 2016-06-05 VITALS — Ht 69.0 in | Wt 252.0 lb

## 2016-06-05 DIAGNOSIS — E1165 Type 2 diabetes mellitus with hyperglycemia: Secondary | ICD-10-CM

## 2016-06-05 DIAGNOSIS — E669 Obesity, unspecified: Secondary | ICD-10-CM | POA: Insufficient documentation

## 2016-06-05 DIAGNOSIS — I10 Essential (primary) hypertension: Secondary | ICD-10-CM | POA: Diagnosis present

## 2016-06-05 DIAGNOSIS — E118 Type 2 diabetes mellitus with unspecified complications: Secondary | ICD-10-CM

## 2016-06-05 DIAGNOSIS — IMO0002 Reserved for concepts with insufficient information to code with codable children: Secondary | ICD-10-CM

## 2016-06-05 DIAGNOSIS — E119 Type 2 diabetes mellitus without complications: Secondary | ICD-10-CM | POA: Insufficient documentation

## 2016-06-05 NOTE — Patient Instructions (Signed)
Goals 1. Continue  Plate Method 2. Cut out hotdogs 3. Increase protein and vegetables. 4.  Try Greek yogurt 5. Make better choices when eating out.

## 2016-06-05 NOTE — Progress Notes (Signed)
Diabetes Self-Management Education  Visit Type:    Appt. Start Time: 1330 Appt. End Time: 1430  06/05/2016  [name], identified by name and date of birth, is a 75 y.o. female with a diagnosis of Diabetes: Type 2. She lost her husband 2 yrs ago and has been eating a lot covenience and fast foods and eating out with friends. Not exercising much due to arthritis. No family history of DM. Was on some steroids a few months ago. Wants to lose a few pounds and eat healthier and feel better. Not on any medications. Wants to control it with diet and exercise and weight loss..   Wt Readings from Last 3 Encounters:  06/05/16 252 lb (114.306 kg)  05/02/16 250 lb 3.2 oz (113.49 kg)  09/17/15 243 lb (110.224 kg)   Ht Readings from Last 3 Encounters:  06/05/16 5\' 9"  (1.753 m)  05/02/16 5\' 9"  (1.753 m)  09/17/15 5\' 9"  (1.753 m)   Body mass index is 37.2 kg/(m^2). @BM[facility] @ Facility age limit for growth percentiles is 20 years. Facility age limit for growth percentiles is 20 years.  ASSESSMENT  Height 5\' 9"  (1.753 m), weight 252 lb (114.306 kg). Body mass index is 37.2 kg/(m^2).      Diabetes Self-Management Education - 05/02/16 1451    Visit Information   Visit Type First/Initial   Initial Visit   Diabetes Type Type 1   Are you taking your medications as prescribed? Yes   Health Coping   How would you rate your overall health? Good   Psychosocial Assessment   Patient Belief/Attitude about Diabetes Motivated to manage diabetes   Self-care barriers None   Self-management support Friends;Family;Doctor's office   Other persons present Patient   Patient Concerns Nutrition/Meal planning;Healthy Lifestyle;Weight Control   Special Needs None   Preferred Learning Style Visual;Auditory   Learning Readiness Ready   How often do you need to have someone help you when you read instructions, pamphlets, or other written materials from your doctor or pharmacy? 1 - Never   What is the last  grade level you completed in school? 12   Pre-Education Assessment   Patient understands the diabetes disease and treatment process. Needs Instruction   Patient understands incorporating nutritional management into lifestyle. Needs Instruction   Patient undertands incorporating physical activity into lifestyle. Needs Instruction   Patient understands using medications safely. Needs Instruction   Patient understands monitoring blood glucose, interpreting and using results Needs Instruction   Patient understands prevention, detection, and treatment of acute complications. Needs Instruction   Patient understands prevention, detection, and treatment of chronic complications. Needs Instruction   Patient understands how to develop strategies to address psychosocial issues. Needs Instruction   Complications   Last HgB A1C per patient/outside source 73 %   How often do you check your blood sugar? 0 times/day (not testing)   Dietary Intake   Breakfast PB on toast or bagel   Snack (morning) fruit   Lunch eats out at fast food   Dinner eats out [employer] (evening) misc   Beverage(s) diet sodas, sweet tea   Exercise   Exercise Type ADL's   How many days per week to you exercise? 0   Patient Education   Previous Diabetes Education No   Disease state  Definition of diabetes, type 1 and 2, and the diagnosis of diabetes;Factors that contribute to the development of diabetes;Explored patient's options for treatment of their diabetes   Nutrition management  Role of  diet in the treatment of diabetes and the relationship between the three main macronutrients and blood glucose level;Food label reading, portion sizes and measuring food.;Carbohydrate counting;Reviewed blood glucose goals for pre and post meals and how to evaluate the patients' food intake on their blood glucose level.;Meal timing in regards to the patients' current diabetes medication.   Physical activity and exercise  Role of exercise  on diabetes management, blood pressure control and cardiac health.;Identified with patient nutritional and/or medication changes necessary with exercise.   Chronic complications Relationship between chronic complications and blood glucose control;Assessed and discussed foot care and prevention of foot problems   Psychosocial adjustment Role of stress on diabetes;Identified and addressed patients feelings and concerns about diabetes   Personal strategies to promote health Lifestyle issues that need to be addressed for better diabetes care;Helped patient develop diabetes management plan for (enter comment)   Individualized Goals (developed by patient)   Nutrition Follow meal plan discussed;General guidelines for healthy choices and portions discussed   Physical Activity Exercise 3-5 times per week   Medications Not Applicable   Monitoring  Not Applicable   Reducing Risk do foot checks daily   Post-Education Assessment   Patient understands the diabetes disease and treatment process. Demonstrates understanding / competency   Patient understands incorporating nutritional management into lifestyle. Needs Review   Patient undertands incorporating physical activity into lifestyle. Demonstrates understanding / competency   Patient understands prevention, detection, and treatment of acute complications. Demonstrates understanding / competency   Patient understands prevention, detection, and treatment of chronic complications. Demonstrates understanding / competency   Patient understands how to develop strategies to address psychosocial issues. Demonstrates understanding / competency   Patient understands how to develop strategies to promote health/change behavior. Demonstrates understanding / competency   Outcomes   Expected Outcomes Demonstrated interest in learning. Expect positive outcomes   Future DMSE 4-6 wks   Program Status Completed      Individualized Plan for Diabetes Self-Management  Training:   Learning Objective:  Patient will have a greater understanding of diabetes self-management. Patient education plan is to attend individual and/or group sessions per assessed needs and concerns.   Plan:   There are no Patient Instructions on file for this visit.  Expected Outcomes:     Education material provided: Living Well with Diabetes, Food label handouts, A1C conversion sheet, Meal plan card and My Plate  If problems or questions, patient to contact team via:  Phone and Email  Future DSME appointment:

## 2016-06-05 NOTE — Progress Notes (Signed)
Diabetes Self-Management Education  Visit Type:  Follow-up  Appt. Start Time:1330 Appt. End Time: 1430  06/05/2016  [name], identified by name and date of birth, is a 75 y.o. female with a diagnosis of Diabetes: Type 2.   She has cut out a lot of her sweet tea and trying to eat more salads. Still having issues with eating out a lot and eating foods high in sodium. Is working on selling her house and is stressed over getting rid of her belongings. ASSESSMENT  Height 5\' 9"  (1.753 m), weight 252 lb (114.306 kg). Body mass index is 37.2 kg/(m^2).       Diabetes Self-Management Education - 06/05/16 1351    Health Coping   How would you rate your overall health? Good   Psychosocial Assessment   Patient Belief/Attitude about Diabetes Motivated to manage diabetes   Pre-Education Assessment   Patient understands the diabetes disease and treatment process. Demonstrates understanding / competency   Patient understands incorporating nutritional management into lifestyle. Needs Review   Patient undertands incorporating physical activity into lifestyle. Needs Review   Patient understands using medications safely. Demonstrates understanding / competency   Patient understands monitoring blood glucose, interpreting and using results Demonstrates understanding / competency   Patient understands prevention, detection, and treatment of acute complications. Demonstrates understanding / competency   Patient understands prevention, detection, and treatment of chronic complications. Demonstrates understanding / competency   Patient understands how to develop strategies to address psychosocial issues. Needs Review   Patient understands how to develop strategies to promote health/change behavior. --  Needs constant encouragement and reinforcement   Complications   Last HgB A1C per patient/outside source 7.3 %   How often do you check your blood sugar? 0 times/day (not testing)   Dietary Intake   Breakfast FIg newtions and 1/2 PB sandwich and coffee   Snack (morning) cottage cheese and fruit   Lunch Salad with grilled chicken,   Dinner Eats ouit fast food drive thru: burger   Exercise   Exercise Type ADL's   Patient Education   Nutrition management  Carbohydrate counting;Information on hints to eating out and maintain blood glucose control.   Psychosocial adjustment Role of stress on diabetes;Identified and addressed patients feelings and concerns about diabetes   Individualized Goals (developed by patient)   Nutrition Follow meal plan discussed;General guidelines for healthy choices and portions discussed;Adjust meds/carbs with exercise as discussed   Problem Solving --  Making better choices when eating out. Avoiding salty foods   Patient Self-Evaluation of Goals - Patient rates self as meeting previously set goals (% of time)   Nutrition 50 - 75 %   Physical Activity < 25%   Problem Solving 50 - 75 %   Reducing Risk 50 - 75 %   Health Coping 25 - 50%   Post-Education Assessment   Patient understands incorporating nutritional management into lifestyle. Needs Review   Patient undertands incorporating physical activity into lifestyle. Needs Review   Patient understands prevention, detection, and treatment of chronic complications. Needs Review   Outcomes   Program Status Completed   Subsequent Visit   Since your last visit have you continued or begun to take your medications as prescribed? No   Since your last visit have you had your blood pressure checked? No   Weight Gain (lbs) 2   Since your last visit, are you checking your blood glucose at least once a day? No      Learning Objective:  Patient  will have a greater understanding of diabetes self-management. Patient education plan is to attend individual and/or group sessions per assessed needs and concerns.   Plan:   Patient Instructions  Goals 1. Continue  Plate Method 2. Cut out hotdogs 3. Increase protein and  vegetables. 4.  Try Greek yogurt 5. Make better choices when eating out.      Expected Outcomes:  Demonstrated interest in learning. Expect positive outcomes  Education material provided: Meal plan card and Carbohydrate counting sheet  If problems or questions, patient to contact team via:  Phone and Email  Future DSME appointment: - 2 months

## 2016-06-18 ENCOUNTER — Other Ambulatory Visit: Payer: Medicare Other | Admitting: Adult Health

## 2016-07-02 ENCOUNTER — Ambulatory Visit (INDEPENDENT_AMBULATORY_CARE_PROVIDER_SITE_OTHER): Payer: Medicare Other | Admitting: Adult Health

## 2016-07-02 ENCOUNTER — Encounter: Payer: Self-pay | Admitting: Adult Health

## 2016-07-02 VITALS — BP 128/70 | HR 74 | Ht 69.0 in | Wt 248.5 lb

## 2016-07-02 DIAGNOSIS — Z1211 Encounter for screening for malignant neoplasm of colon: Secondary | ICD-10-CM

## 2016-07-02 DIAGNOSIS — Z01419 Encounter for gynecological examination (general) (routine) without abnormal findings: Secondary | ICD-10-CM | POA: Diagnosis not present

## 2016-07-02 LAB — HEMOCCULT GUIAC POC 1CARD (OFFICE): FECAL OCCULT BLD: NEGATIVE

## 2016-07-02 NOTE — Progress Notes (Signed)
Patient ID: Robyn Pena, female   DOB: [birth year], 75 y.o.   MRN: [id number] History of Present Illness: Robyn Pena is a 75 year old white female, widowed in for a well woman gyn exam, she is sp hysterectomy.She recently saw Dr Rolm Bookbinder and had several areas frozen off. PCP is Dr Willey Blade.   Current Medications, Allergies, Past Medical History, Past Surgical History, Family History and Social History were reviewed in [employer] record.     Review of Systems:  Patient denies any headaches, hearing loss, fatigue, blurred vision, shortness of breath, chest pain(has heartburn), abdominal pain, problems with bowel movements, urination, or intercourse(not having sex). No joint pain or mood swings.   Physical Exam:BP 128/70 mmHg  Pulse 74  Ht 5\' 9"  (1.753 m)  Wt 248 lb 8 oz (112.719 kg)  BMI 36.68 kg/m2 General:  Well developed, well nourished, no acute distress Skin:  Warm and dry, has increased number of AKs Neck:  Midline trachea, normal thyroid, good ROM, no lymphadenopathy,no carotid bruits heard Lungs; Clear to auscultation bilaterally Breast:  No dominant palpable mass, retraction, or nipple discharge Cardiovascular: Regular rate and rhythm Abdomen:  Soft, non tender, no hepatosplenomegaly Pelvic:  External genitalia is normal in appearance, no lesions.  The vagina is atrophic. Urethra has no lesions or masses. The cervix and uterus are absent. No adnexal masses or tenderness noted.Bladder is non tender, no masses felt. Rectal: Good sphincter tone, no polyps, or hemorrhoids felt.  Hemoccult negative. Extremities/musculoskeletal:  No swelling or varicosities noted, no clubbing or cyanosis Psych:  No mood changes, alert and cooperative,seems happy   Impression: Well woman gyn exam no pap    Plan: Physical in 2 years Mammogram yearly Labs with PCP Colonoscopy per GI

## 2016-07-02 NOTE — Patient Instructions (Signed)
Gyn physical in 2 year Mammogram yearly Labs with PCP Colonoscopy per GI

## 2016-08-07 ENCOUNTER — Encounter: Payer: Medicare Other | Attending: Internal Medicine | Admitting: Nutrition

## 2016-08-07 VITALS — Ht 69.0 in | Wt 241.2 lb

## 2016-08-07 DIAGNOSIS — E119 Type 2 diabetes mellitus without complications: Secondary | ICD-10-CM | POA: Insufficient documentation

## 2016-08-07 DIAGNOSIS — E118 Type 2 diabetes mellitus with unspecified complications: Secondary | ICD-10-CM

## 2016-08-07 DIAGNOSIS — I10 Essential (primary) hypertension: Secondary | ICD-10-CM | POA: Diagnosis present

## 2016-08-07 DIAGNOSIS — E669 Obesity, unspecified: Secondary | ICD-10-CM | POA: Insufficient documentation

## 2016-08-07 NOTE — Patient Instructions (Signed)
Goals 1. Continue to cut down on fast foods. 2. Increase water intake to 2 bottles of water 3. Exercise- 10 mintues three times per week on  recumbent bike. 4. Increase protein. 5. Talk to Dr. Willey Blade about Meformin 500 mg BID if A1C isn't less than 6%.

## 2016-08-07 NOTE — Progress Notes (Signed)
   Diabetes Self-Management Education  Visit Type:  Follow-up  Appt. Start Time: 1330 Appt. End Time: 1400 08/07/16 [name], identified by name and date of birth, is a 75 y.o. female with a diagnosis of Diabetes: (P) Type 2.   ASSESSMENT  Height 5\' 9"  (1.753 m), weight 241 lb 3.2 oz (109.4 kg). Body mass index is 35.62 kg/m.       Diabetes Self-Management Education - 08/12/16 0800      Health Coping   How would you rate your overall health? (P)  Good     Psychosocial Assessment   Patient Belief/Attitude about Diabetes (P)  Motivated to manage diabetes   Self-care barriers (P)  None   Self-management support (P)  Doctor's office;Family   Patient Concerns (P)  Nutrition/Meal planning;Medication;Monitoring;Healthy Lifestyle;Problem Solving;Weight Control   Special Needs (P)  None   Preferred Learning Style (P)  No preference indicated   Learning Readiness (P)  Ready     Complications   Last HgB A1C per patient/outside source (P)  7.3 %   How often do you check your blood sugar? (P)  1-2 times/day   Postprandial Blood glucose range (mg/dL) (P)  70-129   Have you had a dilated eye exam in the past 12 months? (P)  Yes   Are you checking your feet? (P)  Yes     Subsequent Visit   Since your last visit have you continued or begun to take your medications as prescribed? (P)  Yes   Since your last visit have you had your blood pressure checked? (P)  No   Since your last visit have you experienced any weight changes? (P)  No change   Since your last visit, are you checking your blood glucose at least once a day? (P)  Yes      Learning Objective:  Patient will have a greater understanding of diabetes self-management. Patient education plan is to attend individual and/or group sessions per assessed needs and concerns.   Plan:   Patient Instructions  Goals 1. Continue to cut down on fast foods. 2. Increase water intake to 2 bottles of water 3. Exercise- 10 mintues  three times per week on  recumbent bike. 4. Increase protein. 5. Talk to Dr. Willey Pena about Meformin 500 mg BID if A1C isn't less than 6%.      Expected Outcomes:   Improved blood sugars and weight loss and increased DM knowledge  Education material provided: A1C conversion sheet, Meal plan card and My Plate  If problems or questions, patient to contact team via:  Phone and Email  Future DSME appointment: -  2-4 months

## 2016-09-06 ENCOUNTER — Other Ambulatory Visit (HOSPITAL_COMMUNITY): Payer: Self-pay | Admitting: Internal Medicine

## 2016-09-06 DIAGNOSIS — N6002 Solitary cyst of left breast: Secondary | ICD-10-CM

## 2016-09-24 ENCOUNTER — Other Ambulatory Visit (HOSPITAL_COMMUNITY): Payer: Self-pay | Admitting: Internal Medicine

## 2016-09-24 DIAGNOSIS — Z09 Encounter for follow-up examination after completed treatment for conditions other than malignant neoplasm: Secondary | ICD-10-CM

## 2016-09-24 DIAGNOSIS — N6002 Solitary cyst of left breast: Secondary | ICD-10-CM

## 2016-10-01 ENCOUNTER — Ambulatory Visit (HOSPITAL_COMMUNITY)
Admission: RE | Admit: 2016-10-01 | Discharge: 2016-10-01 | Disposition: A | Discharge status: Home / Self Care | Payer: Medicare Other | Source: Ambulatory Visit | Attending: Internal Medicine | Admitting: Internal Medicine

## 2016-10-01 DIAGNOSIS — N6002 Solitary cyst of left breast: Secondary | ICD-10-CM | POA: Diagnosis present

## 2016-10-01 DIAGNOSIS — Z09 Encounter for follow-up examination after completed treatment for conditions other than malignant neoplasm: Secondary | ICD-10-CM

## 2016-11-06 ENCOUNTER — Ambulatory Visit: Payer: Medicare Other | Admitting: Nutrition

## 2016-11-22 DEATH — deceased
